# Patient Record
Sex: Male | Born: 1970 | Race: Black or African American | Hispanic: No | Marital: Single | Smoking: Never smoker
Health system: Southern US, Community
[De-identification: ages and names within clinical notes are randomized; demographics above are authoritative.]

## PROBLEM LIST (undated history)

## (undated) DIAGNOSIS — K219 Gastro-esophageal reflux disease without esophagitis: Secondary | ICD-10-CM

## (undated) DIAGNOSIS — N189 Chronic kidney disease, unspecified: Secondary | ICD-10-CM

## (undated) DIAGNOSIS — G4730 Sleep apnea, unspecified: Secondary | ICD-10-CM

## (undated) DIAGNOSIS — J189 Pneumonia, unspecified organism: Secondary | ICD-10-CM

## (undated) DIAGNOSIS — I10 Essential (primary) hypertension: Secondary | ICD-10-CM

## (undated) DIAGNOSIS — D759 Disease of blood and blood-forming organs, unspecified: Secondary | ICD-10-CM

## (undated) DIAGNOSIS — Z973 Presence of spectacles and contact lenses: Secondary | ICD-10-CM

## (undated) HISTORY — DX: Sleep apnea, unspecified: G47.30

## (undated) HISTORY — DX: Essential (primary) hypertension: I10

## (undated) HISTORY — PX: OTHER SURGICAL HISTORY: SHX169

## (undated) HISTORY — PX: AV FISTULA PLACEMENT: SHX1204

---

## 2001-01-07 ENCOUNTER — Emergency Department (HOSPITAL_COMMUNITY): Admission: EM | Admit: 2001-01-07 | Discharge: 2001-01-07 | Payer: Self-pay | Admitting: *Deleted

## 2001-01-08 ENCOUNTER — Encounter: Payer: Self-pay | Admitting: Emergency Medicine

## 2001-03-01 ENCOUNTER — Encounter: Admission: RE | Admit: 2001-03-01 | Discharge: 2001-04-19 | Payer: Self-pay | Admitting: Occupational Medicine

## 2004-11-16 ENCOUNTER — Emergency Department (HOSPITAL_COMMUNITY): Admission: EM | Admit: 2004-11-16 | Discharge: 2004-11-16 | Payer: Self-pay | Admitting: Family Medicine

## 2009-03-30 HISTORY — PX: PERITONEAL CATHETER INSERTION: SHX2223

## 2009-07-25 ENCOUNTER — Emergency Department (HOSPITAL_COMMUNITY): Admission: EM | Admit: 2009-07-25 | Discharge: 2009-07-25 | Payer: Self-pay | Admitting: Family Medicine

## 2009-08-12 ENCOUNTER — Inpatient Hospital Stay (HOSPITAL_COMMUNITY): Admission: EM | Admit: 2009-08-12 | Discharge: 2009-08-29 | Payer: Self-pay | Admitting: Emergency Medicine

## 2009-08-12 ENCOUNTER — Ambulatory Visit: Payer: Self-pay | Admitting: Emergency Medicine

## 2009-08-12 ENCOUNTER — Encounter (INDEPENDENT_AMBULATORY_CARE_PROVIDER_SITE_OTHER): Payer: Self-pay | Admitting: Internal Medicine

## 2009-08-12 ENCOUNTER — Ambulatory Visit: Payer: Self-pay | Admitting: Oncology

## 2009-08-12 ENCOUNTER — Ambulatory Visit: Payer: Self-pay | Admitting: Internal Medicine

## 2009-08-15 ENCOUNTER — Encounter (INDEPENDENT_AMBULATORY_CARE_PROVIDER_SITE_OTHER): Payer: Self-pay | Admitting: Family Medicine

## 2009-08-21 ENCOUNTER — Ambulatory Visit: Payer: Self-pay | Admitting: Vascular Surgery

## 2009-08-21 ENCOUNTER — Encounter: Payer: Self-pay | Admitting: Vascular Surgery

## 2009-09-11 ENCOUNTER — Ambulatory Visit (HOSPITAL_COMMUNITY): Admission: RE | Admit: 2009-09-11 | Discharge: 2009-09-11 | Payer: Self-pay | Admitting: Nephrology

## 2009-10-04 ENCOUNTER — Inpatient Hospital Stay (HOSPITAL_COMMUNITY): Admission: RE | Admit: 2009-10-04 | Discharge: 2009-10-05 | Payer: Self-pay | Admitting: General Surgery

## 2009-11-19 ENCOUNTER — Ambulatory Visit (HOSPITAL_COMMUNITY): Admission: RE | Admit: 2009-11-19 | Discharge: 2009-11-19 | Payer: Self-pay | Admitting: Nephrology

## 2010-02-06 ENCOUNTER — Ambulatory Visit (HOSPITAL_COMMUNITY): Admission: RE | Admit: 2010-02-06 | Discharge: 2010-02-06 | Payer: Self-pay | Admitting: Nephrology

## 2010-06-10 LAB — CROSSMATCH
ABO/RH(D): A POS
Antibody Screen: NEGATIVE
Unit division: 0

## 2010-06-15 LAB — CBC
HCT: 45 % (ref 39.0–52.0)
Hemoglobin: 12 g/dL — ABNORMAL LOW (ref 13.0–17.0)
Hemoglobin: 14.5 g/dL (ref 13.0–17.0)
MCH: 28 pg (ref 26.0–34.0)
MCHC: 32 g/dL (ref 30.0–36.0)
MCV: 86.9 fL (ref 78.0–100.0)
Platelets: 194 10*3/uL (ref 150–400)
RBC: 4.35 MIL/uL (ref 4.22–5.81)
RDW: 18.2 % — ABNORMAL HIGH (ref 11.5–15.5)
RDW: 18.5 % — ABNORMAL HIGH (ref 11.5–15.5)

## 2010-06-15 LAB — BASIC METABOLIC PANEL
CO2: 28 mEq/L (ref 19–32)
Calcium: 9.1 mg/dL (ref 8.4–10.5)
Calcium: 9.6 mg/dL (ref 8.4–10.5)
Chloride: 100 mEq/L (ref 96–112)
Chloride: 95 mEq/L — ABNORMAL LOW (ref 96–112)
Creatinine, Ser: 12.25 mg/dL — ABNORMAL HIGH (ref 0.4–1.5)
GFR calc Af Amer: 6 mL/min — ABNORMAL LOW (ref >60)
GFR calc Af Amer: 7 mL/min — ABNORMAL LOW (ref >60)
Glucose, Bld: 111 mg/dL — ABNORMAL HIGH (ref 70–99)
Potassium: 4.7 mEq/L (ref 3.5–5.1)

## 2010-06-15 LAB — DIFFERENTIAL
Eosinophils Absolute: 0.2 10*3/uL (ref 0.0–0.7)
Lymphocytes Relative: 31 % (ref 12–46)
Lymphs Abs: 2.6 10*3/uL (ref 0.7–4.0)
Monocytes Absolute: 1.1 10*3/uL — ABNORMAL HIGH (ref 0.1–1.0)
Monocytes Relative: 14 % — ABNORMAL HIGH (ref 3–12)
Neutro Abs: 4.4 10*3/uL (ref 1.7–7.7)
Neutrophils Relative %: 53 % (ref 43–77)

## 2010-06-15 LAB — POCT I-STAT 4, (NA,K, GLUC, HGB,HCT)
Glucose, Bld: 96 mg/dL (ref 70–99)
Hemoglobin: 15.6 g/dL (ref 13.0–17.0)
Sodium: 139 mEq/L (ref 135–145)

## 2010-06-15 LAB — RENAL FUNCTION PANEL
Chloride: 96 mEq/L (ref 96–112)
Creatinine, Ser: 12.11 mg/dL — ABNORMAL HIGH (ref 0.4–1.5)
GFR calc Af Amer: 6 mL/min — ABNORMAL LOW (ref >60)
Glucose, Bld: 111 mg/dL — ABNORMAL HIGH (ref 70–99)
Phosphorus: 7.1 mg/dL — ABNORMAL HIGH (ref 2.3–4.6)
Sodium: 137 mEq/L (ref 135–145)

## 2010-06-16 LAB — THERAPEUTIC PLASMA EXCHANGE (BLOOD BANK)
Plasma Exchange: 4050
Plasma Exchange: 4250

## 2010-06-16 LAB — BRAIN NATRIURETIC PEPTIDE
Pro B Natriuretic peptide (BNP): 1856 pg/mL — ABNORMAL HIGH (ref 0.0–100.0)
Pro B Natriuretic peptide (BNP): 1929 pg/mL — ABNORMAL HIGH (ref 0.0–100.0)

## 2010-06-16 LAB — RENAL FUNCTION PANEL
Albumin: 2.7 g/dL — ABNORMAL LOW (ref 3.5–5.2)
Albumin: 3.2 g/dL — ABNORMAL LOW (ref 3.5–5.2)
Albumin: 3.2 g/dL — ABNORMAL LOW (ref 3.5–5.2)
Albumin: 3.2 g/dL — ABNORMAL LOW (ref 3.5–5.2)
Albumin: 3.4 g/dL — ABNORMAL LOW (ref 3.5–5.2)
Albumin: 3.5 g/dL (ref 3.5–5.2)
BUN: 57 mg/dL — ABNORMAL HIGH (ref 6–23)
BUN: 103 mg/dL — ABNORMAL HIGH (ref 6–23)
BUN: 115 mg/dL — ABNORMAL HIGH (ref 6–23)
BUN: 122 mg/dL — ABNORMAL HIGH (ref 6–23)
BUN: 45 mg/dL — ABNORMAL HIGH (ref 6–23)
BUN: 84 mg/dL — ABNORMAL HIGH (ref 6–23)
BUN: 87 mg/dL — ABNORMAL HIGH (ref 6–23)
CO2: 24 mEq/L (ref 19–32)
CO2: 26 mEq/L (ref 19–32)
CO2: 28 mEq/L (ref 19–32)
CO2: 28 mEq/L (ref 19–32)
CO2: 29 mEq/L (ref 19–32)
CO2: 31 mEq/L (ref 19–32)
CO2: 33 mEq/L — ABNORMAL HIGH (ref 19–32)
CO2: 34 mEq/L — ABNORMAL HIGH (ref 19–32)
CO2: 35 mEq/L — ABNORMAL HIGH (ref 19–32)
CO2: 37 mEq/L — ABNORMAL HIGH (ref 19–32)
Calcium: 8.7 mg/dL (ref 8.4–10.5)
Calcium: 8.1 mg/dL — ABNORMAL LOW (ref 8.4–10.5)
Calcium: 8.2 mg/dL — ABNORMAL LOW (ref 8.4–10.5)
Calcium: 8.3 mg/dL — ABNORMAL LOW (ref 8.4–10.5)
Calcium: 8.4 mg/dL (ref 8.4–10.5)
Calcium: 8.4 mg/dL (ref 8.4–10.5)
Calcium: 8.5 mg/dL (ref 8.4–10.5)
Calcium: 8.6 mg/dL (ref 8.4–10.5)
Calcium: 8.8 mg/dL (ref 8.4–10.5)
Calcium: 9.2 mg/dL (ref 8.4–10.5)
Chloride: 86 mEq/L — ABNORMAL LOW (ref 96–112)
Chloride: 94 mEq/L — ABNORMAL LOW (ref 96–112)
Chloride: 94 mEq/L — ABNORMAL LOW (ref 96–112)
Chloride: 97 mEq/L (ref 96–112)
Chloride: 98 mEq/L (ref 96–112)
Chloride: 98 mEq/L (ref 96–112)
Creatinine, Ser: 10.9 mg/dL — ABNORMAL HIGH (ref 0.4–1.5)
Creatinine, Ser: 13.84 mg/dL — ABNORMAL HIGH (ref 0.4–1.5)
Creatinine, Ser: 14.51 mg/dL — ABNORMAL HIGH (ref 0.4–1.5)
Creatinine, Ser: 14.8 mg/dL — ABNORMAL HIGH (ref 0.4–1.5)
GFR calc Af Amer: 6 mL/min — ABNORMAL LOW (ref >60)
GFR calc Af Amer: 4 mL/min — ABNORMAL LOW (ref >60)
GFR calc Af Amer: 4 mL/min — ABNORMAL LOW (ref >60)
GFR calc Af Amer: 5 mL/min — ABNORMAL LOW (ref >60)
GFR calc Af Amer: 5 mL/min — ABNORMAL LOW (ref >60)
GFR calc Af Amer: 5 mL/min — ABNORMAL LOW (ref >60)
GFR calc Af Amer: 6 mL/min — ABNORMAL LOW (ref >60)
GFR calc Af Amer: 6 mL/min — ABNORMAL LOW (ref >60)
GFR calc Af Amer: 6 mL/min — ABNORMAL LOW (ref >60)
GFR calc Af Amer: 8 mL/min — ABNORMAL LOW (ref >60)
GFR calc Af Amer: 8 mL/min — ABNORMAL LOW (ref >60)
GFR calc non Af Amer: 4 mL/min — ABNORMAL LOW (ref >60)
GFR calc non Af Amer: 4 mL/min — ABNORMAL LOW (ref >60)
GFR calc non Af Amer: 4 mL/min — ABNORMAL LOW (ref >60)
GFR calc non Af Amer: 4 mL/min — ABNORMAL LOW (ref >60)
GFR calc non Af Amer: 5 mL/min — ABNORMAL LOW (ref >60)
GFR calc non Af Amer: 5 mL/min — ABNORMAL LOW (ref >60)
GFR calc non Af Amer: 5 mL/min — ABNORMAL LOW (ref >60)
GFR calc non Af Amer: 7 mL/min — ABNORMAL LOW (ref >60)
GFR calc non Af Amer: 7 mL/min — ABNORMAL LOW (ref >60)
Glucose, Bld: 92 mg/dL (ref 70–99)
Glucose, Bld: 102 mg/dL — ABNORMAL HIGH (ref 70–99)
Glucose, Bld: 103 mg/dL — ABNORMAL HIGH (ref 70–99)
Glucose, Bld: 109 mg/dL — ABNORMAL HIGH (ref 70–99)
Glucose, Bld: 127 mg/dL — ABNORMAL HIGH (ref 70–99)
Glucose, Bld: 151 mg/dL — ABNORMAL HIGH (ref 70–99)
Glucose, Bld: 93 mg/dL (ref 70–99)
Glucose, Bld: 96 mg/dL (ref 70–99)
Glucose, Bld: 97 mg/dL (ref 70–99)
Glucose, Bld: 99 mg/dL (ref 70–99)
Phosphorus: 4.4 mg/dL (ref 2.3–4.6)
Phosphorus: 11.5 mg/dL (ref 2.3–4.6)
Phosphorus: 4.3 mg/dL (ref 2.3–4.6)
Phosphorus: 5.2 mg/dL — ABNORMAL HIGH (ref 2.3–4.6)
Phosphorus: 5.7 mg/dL — ABNORMAL HIGH (ref 2.3–4.6)
Phosphorus: 6.2 mg/dL — ABNORMAL HIGH (ref 2.3–4.6)
Phosphorus: 7.1 mg/dL — ABNORMAL HIGH (ref 2.3–4.6)
Phosphorus: 7.7 mg/dL — ABNORMAL HIGH (ref 2.3–4.6)
Phosphorus: 8.9 mg/dL — ABNORMAL HIGH (ref 2.3–4.6)
Phosphorus: 9.9 mg/dL (ref 2.3–4.6)
Potassium: 3.3 mEq/L — ABNORMAL LOW (ref 3.5–5.1)
Potassium: 3.7 mEq/L (ref 3.5–5.1)
Potassium: 3.9 mEq/L (ref 3.5–5.1)
Potassium: 4.1 mEq/L (ref 3.5–5.1)
Potassium: 4.8 mEq/L (ref 3.5–5.1)
Sodium: 135 mEq/L (ref 135–145)
Sodium: 134 mEq/L — ABNORMAL LOW (ref 135–145)
Sodium: 135 mEq/L (ref 135–145)
Sodium: 135 mEq/L (ref 135–145)
Sodium: 136 mEq/L (ref 135–145)
Sodium: 137 mEq/L (ref 135–145)
Sodium: 138 mEq/L (ref 135–145)
Sodium: 138 mEq/L (ref 135–145)
Sodium: 139 mEq/L (ref 135–145)
Sodium: 139 mEq/L (ref 135–145)
Sodium: 140 mEq/L (ref 135–145)
Sodium: 141 mEq/L (ref 135–145)

## 2010-06-16 LAB — DIFFERENTIAL
Basophils Absolute: 0 10*3/uL (ref 0.0–0.1)
Basophils Absolute: 0 10*3/uL (ref 0.0–0.1)
Basophils Absolute: 0 10*3/uL (ref 0.0–0.1)
Basophils Absolute: 0 10*3/uL (ref 0.0–0.1)
Basophils Absolute: 0 10*3/uL (ref 0.0–0.1)
Basophils Absolute: 0.1 10*3/uL (ref 0.0–0.1)
Basophils Absolute: 0.1 10*3/uL (ref 0.0–0.1)
Basophils Relative: 1 % (ref 0–1)
Basophils Relative: 1 % (ref 0–1)
Basophils Relative: 0 % (ref 0–1)
Basophils Relative: 0 % (ref 0–1)
Basophils Relative: 0 % (ref 0–1)
Basophils Relative: 0 % (ref 0–1)
Basophils Relative: 0 % (ref 0–1)
Basophils Relative: 0 % (ref 0–1)
Basophils Relative: 0 % (ref 0–1)
Basophils Relative: 0 % (ref 0–1)
Basophils Relative: 1 % (ref 0–1)
Eosinophils Absolute: 0.2 10*3/uL (ref 0.0–0.7)
Eosinophils Absolute: 0.3 10*3/uL (ref 0.0–0.7)
Eosinophils Absolute: 0 10*3/uL (ref 0.0–0.7)
Eosinophils Absolute: 0 10*3/uL (ref 0.0–0.7)
Eosinophils Absolute: 0 10*3/uL (ref 0.0–0.7)
Eosinophils Absolute: 0.1 10*3/uL (ref 0.0–0.7)
Eosinophils Absolute: 0.3 10*3/uL (ref 0.0–0.7)
Eosinophils Absolute: 0.4 10*3/uL (ref 0.0–0.7)
Eosinophils Relative: 0 % (ref 0–5)
Eosinophils Relative: 0 % (ref 0–5)
Eosinophils Relative: 2 % (ref 0–5)
Eosinophils Relative: 3 % (ref 0–5)
Lymphocytes Relative: 11 % — ABNORMAL LOW (ref 12–46)
Lymphocytes Relative: 13 % (ref 12–46)
Lymphocytes Relative: 14 % (ref 12–46)
Lymphocytes Relative: 14 % (ref 12–46)
Lymphocytes Relative: 16 % (ref 12–46)
Lymphocytes Relative: 2 % — ABNORMAL LOW (ref 12–46)
Lymphocytes Relative: 4 % — ABNORMAL LOW (ref 12–46)
Lymphocytes Relative: 8 % — ABNORMAL LOW (ref 12–46)
Lymphs Abs: 1.3 10*3/uL (ref 0.7–4.0)
Lymphs Abs: 1.8 10*3/uL (ref 0.7–4.0)
Lymphs Abs: 0.4 10*3/uL — ABNORMAL LOW (ref 0.7–4.0)
Lymphs Abs: 1 10*3/uL (ref 0.7–4.0)
Lymphs Abs: 1.5 10*3/uL (ref 0.7–4.0)
Lymphs Abs: 1.7 10*3/uL (ref 0.7–4.0)
Lymphs Abs: 1.8 10*3/uL (ref 0.7–4.0)
Lymphs Abs: 1.8 10*3/uL (ref 0.7–4.0)
Lymphs Abs: 1.8 10*3/uL (ref 0.7–4.0)
Monocytes Absolute: 1.2 10*3/uL — ABNORMAL HIGH (ref 0.1–1.0)
Monocytes Absolute: 1.3 10*3/uL — ABNORMAL HIGH (ref 0.1–1.0)
Monocytes Absolute: 0.4 10*3/uL (ref 0.1–1.0)
Monocytes Absolute: 0.7 10*3/uL (ref 0.1–1.0)
Monocytes Absolute: 1.5 10*3/uL — ABNORMAL HIGH (ref 0.1–1.0)
Monocytes Absolute: 1.5 10*3/uL — ABNORMAL HIGH (ref 0.1–1.0)
Monocytes Absolute: 1.6 10*3/uL — ABNORMAL HIGH (ref 0.1–1.0)
Monocytes Relative: 11 % (ref 3–12)
Monocytes Relative: 14 % — ABNORMAL HIGH (ref 3–12)
Monocytes Relative: 10 % (ref 3–12)
Monocytes Relative: 11 % (ref 3–12)
Monocytes Relative: 11 % (ref 3–12)
Monocytes Relative: 2 % — ABNORMAL LOW (ref 3–12)
Monocytes Relative: 3 % (ref 3–12)
Monocytes Relative: 4 % (ref 3–12)
Monocytes Relative: 9 % (ref 3–12)
Neutro Abs: 11.3 10*3/uL — ABNORMAL HIGH (ref 1.7–7.7)
Neutro Abs: 13.6 10*3/uL — ABNORMAL HIGH (ref 1.7–7.7)
Neutro Abs: 16.9 10*3/uL — ABNORMAL HIGH (ref 1.7–7.7)
Neutro Abs: 17 10*3/uL — ABNORMAL HIGH (ref 1.7–7.7)
Neutro Abs: 7.3 10*3/uL (ref 1.7–7.7)
Neutro Abs: 8.4 10*3/uL — ABNORMAL HIGH (ref 1.7–7.7)
Neutro Abs: 9.3 10*3/uL — ABNORMAL HIGH (ref 1.7–7.7)
Neutro Abs: 9.7 10*3/uL — ABNORMAL HIGH (ref 1.7–7.7)
Neutro Abs: 9.9 10*3/uL — ABNORMAL HIGH (ref 1.7–7.7)
Neutro Abs: 9.9 10*3/uL — ABNORMAL HIGH (ref 1.7–7.7)
Neutrophils Relative %: 65 % (ref 43–77)
Neutrophils Relative %: 75 % (ref 43–77)
Neutrophils Relative %: 72 % (ref 43–77)
Neutrophils Relative %: 72 % (ref 43–77)
Neutrophils Relative %: 72 % (ref 43–77)
Neutrophils Relative %: 79 % — ABNORMAL HIGH (ref 43–77)
Neutrophils Relative %: 80 % — ABNORMAL HIGH (ref 43–77)
Neutrophils Relative %: 80 % — ABNORMAL HIGH (ref 43–77)
Neutrophils Relative %: 88 % — ABNORMAL HIGH (ref 43–77)
Neutrophils Relative %: 92 % — ABNORMAL HIGH (ref 43–77)
Neutrophils Relative %: 94 % — ABNORMAL HIGH (ref 43–77)
Neutrophils Relative %: 94 % — ABNORMAL HIGH (ref 43–77)

## 2010-06-16 LAB — CBC
HCT: 28 % — ABNORMAL LOW (ref 39.0–52.0)
HCT: 28.1 % — ABNORMAL LOW (ref 39.0–52.0)
HCT: 26.8 % — ABNORMAL LOW (ref 39.0–52.0)
HCT: 27 % — ABNORMAL LOW (ref 39.0–52.0)
HCT: 27.2 % — ABNORMAL LOW (ref 39.0–52.0)
HCT: 27.6 % — ABNORMAL LOW (ref 39.0–52.0)
HCT: 29.5 % — ABNORMAL LOW (ref 39.0–52.0)
Hemoglobin: 9.2 g/dL — ABNORMAL LOW (ref 13.0–17.0)
Hemoglobin: 9.2 g/dL — ABNORMAL LOW (ref 13.0–17.0)
Hemoglobin: 10.1 g/dL — ABNORMAL LOW (ref 13.0–17.0)
Hemoglobin: 10.2 g/dL — ABNORMAL LOW (ref 13.0–17.0)
Hemoglobin: 11.3 g/dL — ABNORMAL LOW (ref 13.0–17.0)
Hemoglobin: 7.9 g/dL — ABNORMAL LOW (ref 13.0–17.0)
Hemoglobin: 9 g/dL — ABNORMAL LOW (ref 13.0–17.0)
Hemoglobin: 9 g/dL — ABNORMAL LOW (ref 13.0–17.0)
Hemoglobin: 9.1 g/dL — ABNORMAL LOW (ref 13.0–17.0)
Hemoglobin: 9.4 g/dL — ABNORMAL LOW (ref 13.0–17.0)
Hemoglobin: 9.5 g/dL — ABNORMAL LOW (ref 13.0–17.0)
MCHC: 32.9 g/dL (ref 30.0–36.0)
MCHC: 32.6 g/dL (ref 30.0–36.0)
MCHC: 33 g/dL (ref 30.0–36.0)
MCHC: 33.1 g/dL (ref 30.0–36.0)
MCHC: 33.2 g/dL (ref 30.0–36.0)
MCHC: 33.9 g/dL (ref 30.0–36.0)
MCHC: 33.9 g/dL (ref 30.0–36.0)
MCHC: 34 g/dL (ref 30.0–36.0)
MCHC: 34.1 g/dL (ref 30.0–36.0)
MCV: 87.2 fL (ref 78.0–100.0)
MCV: 86.3 fL (ref 78.0–100.0)
MCV: 86.3 fL (ref 78.0–100.0)
MCV: 86.9 fL (ref 78.0–100.0)
MCV: 86.9 fL (ref 78.0–100.0)
MCV: 87.4 fL (ref 78.0–100.0)
MCV: 87.9 fL (ref 78.0–100.0)
Platelets: 252 10*3/uL (ref 150–400)
Platelets: 290 10*3/uL (ref 150–400)
Platelets: 126 10*3/uL — ABNORMAL LOW (ref 150–400)
Platelets: 273 10*3/uL (ref 150–400)
Platelets: 273 10*3/uL (ref 150–400)
Platelets: 301 10*3/uL (ref 150–400)
Platelets: 316 10*3/uL (ref 150–400)
Platelets: 83 10*3/uL — ABNORMAL LOW (ref 150–400)
Platelets: 83 10*3/uL — ABNORMAL LOW (ref 150–400)
Platelets: 99 10*3/uL — ABNORMAL LOW (ref 150–400)
RBC: 3.22 MIL/uL — ABNORMAL LOW (ref 4.22–5.81)
RBC: 2.65 MIL/uL — ABNORMAL LOW (ref 4.22–5.81)
RBC: 2.83 MIL/uL — ABNORMAL LOW (ref 4.22–5.81)
RBC: 3.07 MIL/uL — ABNORMAL LOW (ref 4.22–5.81)
RBC: 3.1 MIL/uL — ABNORMAL LOW (ref 4.22–5.81)
RBC: 3.1 MIL/uL — ABNORMAL LOW (ref 4.22–5.81)
RBC: 3.15 MIL/uL — ABNORMAL LOW (ref 4.22–5.81)
RBC: 3.21 MIL/uL — ABNORMAL LOW (ref 4.22–5.81)
RBC: 3.26 MIL/uL — ABNORMAL LOW (ref 4.22–5.81)
RBC: 3.51 MIL/uL — ABNORMAL LOW (ref 4.22–5.81)
RBC: 3.52 MIL/uL — ABNORMAL LOW (ref 4.22–5.81)
RBC: 3.83 MIL/uL — ABNORMAL LOW (ref 4.22–5.81)
RDW: 17.1 % — ABNORMAL HIGH (ref 11.5–15.5)
RDW: 17.3 % — ABNORMAL HIGH (ref 11.5–15.5)
RDW: 17.5 % — ABNORMAL HIGH (ref 11.5–15.5)
RDW: 17.6 % — ABNORMAL HIGH (ref 11.5–15.5)
RDW: 17.7 % — ABNORMAL HIGH (ref 11.5–15.5)
RDW: 17.8 % — ABNORMAL HIGH (ref 11.5–15.5)
RDW: 18.1 % — ABNORMAL HIGH (ref 11.5–15.5)
RDW: 18.2 % — ABNORMAL HIGH (ref 11.5–15.5)
WBC: 9.9 10*3/uL (ref 4.0–10.5)
WBC: 11.6 10*3/uL — ABNORMAL HIGH (ref 4.0–10.5)
WBC: 13.2 10*3/uL — ABNORMAL HIGH (ref 4.0–10.5)
WBC: 13.5 10*3/uL — ABNORMAL HIGH (ref 4.0–10.5)
WBC: 13.7 10*3/uL — ABNORMAL HIGH (ref 4.0–10.5)
WBC: 13.8 10*3/uL — ABNORMAL HIGH (ref 4.0–10.5)
WBC: 17.3 10*3/uL — ABNORMAL HIGH (ref 4.0–10.5)
WBC: 18 10*3/uL — ABNORMAL HIGH (ref 4.0–10.5)
WBC: 18.8 10*3/uL — ABNORMAL HIGH (ref 4.0–10.5)

## 2010-06-16 LAB — RETICULOCYTES
RBC.: 2.87 MIL/uL — ABNORMAL LOW (ref 4.22–5.81)
RBC.: 3.48 MIL/uL — ABNORMAL LOW (ref 4.22–5.81)
Retic Count, Absolute: 87 10*3/uL (ref 19.0–186.0)
Retic Ct Pct: 2.7 % (ref 0.4–3.1)
Retic Ct Pct: 3 % (ref 0.4–3.1)
Retic Ct Pct: 3.3 % — ABNORMAL HIGH (ref 0.4–3.1)
Retic Ct Pct: 3.6 % — ABNORMAL HIGH (ref 0.4–3.1)
Retic Ct Pct: 3.8 % — ABNORMAL HIGH (ref 0.4–3.1)

## 2010-06-16 LAB — UIFE/LIGHT CHAINS/TP QN, 24-HR UR
Alpha 1, Urine: DETECTED — AB
Alpha 2, Urine: DETECTED — AB
Beta, Urine: DETECTED — AB
Free Kappa Lt Chains,Ur: 11.4 mg/dL — ABNORMAL HIGH (ref 0.04–1.51)
Free Lambda Excretion/Day: 32.48 mg/d
Gamma Globulin, Urine: DETECTED — AB
Time: 24 hours

## 2010-06-16 LAB — POCT I-STAT 3, ART BLOOD GAS (G3+)
Bicarbonate: 22.4 mEq/L (ref 20.0–24.0)
O2 Saturation: 89 %
Patient temperature: 98.6
TCO2: 23 mmol/L (ref 0–100)
pO2, Arterial: 52 mmHg — ABNORMAL LOW (ref 80.0–100.0)

## 2010-06-16 LAB — HEPATIC FUNCTION PANEL
AST: 21 U/L (ref 0–37)
Albumin: 3.2 g/dL — ABNORMAL LOW (ref 3.5–5.2)
Alkaline Phosphatase: 50 U/L (ref 39–117)
Bilirubin, Direct: 0.1 mg/dL (ref 0.0–0.3)
Indirect Bilirubin: 0.9 mg/dL (ref 0.3–0.9)
Total Bilirubin: 1 mg/dL (ref 0.3–1.2)
Total Protein: 6.3 g/dL (ref 6.0–8.3)
Total Protein: 7.1 g/dL (ref 6.0–8.3)

## 2010-06-16 LAB — PROTEIN ELECTROPH W RFLX QUANT IMMUNOGLOBULINS
M-Spike, %: NOT DETECTED g/dL
Total Protein ELP: 6.2 g/dL (ref 6.0–8.3)

## 2010-06-16 LAB — COMPREHENSIVE METABOLIC PANEL
ALT: 14 U/L (ref 0–53)
Alkaline Phosphatase: 66 U/L (ref 39–117)
CO2: 22 mEq/L (ref 19–32)
Calcium: 8.9 mg/dL (ref 8.4–10.5)
GFR calc non Af Amer: 5 mL/min — ABNORMAL LOW (ref >60)
Glucose, Bld: 138 mg/dL — ABNORMAL HIGH (ref 70–99)
Potassium: 3.2 mEq/L — ABNORMAL LOW (ref 3.5–5.1)
Sodium: 131 mEq/L — ABNORMAL LOW (ref 135–145)

## 2010-06-16 LAB — MAGNESIUM: Magnesium: 2.4 mg/dL (ref 1.5–2.5)

## 2010-06-16 LAB — CREATININE, URINE, RANDOM: Creatinine, Urine: 81.8 mg/dL

## 2010-06-16 LAB — LACTATE DEHYDROGENASE
LDH: 205 U/L (ref 94–250)
LDH: 184 U/L (ref 94–250)
LDH: 186 U/L (ref 94–250)
LDH: 190 U/L (ref 94–250)
LDH: 190 U/L (ref 94–250)
LDH: 191 U/L (ref 94–250)
LDH: 192 U/L (ref 94–250)
LDH: 205 U/L (ref 94–250)
LDH: 213 U/L (ref 94–250)
LDH: 221 U/L (ref 94–250)
LDH: 226 U/L (ref 94–250)
LDH: 226 U/L (ref 94–250)
LDH: 227 U/L (ref 94–250)
LDH: 256 U/L — ABNORMAL HIGH (ref 94–250)

## 2010-06-16 LAB — PROTIME-INR
INR: 1.09 (ref 0.00–1.49)
Prothrombin Time: 14 seconds (ref 11.6–15.2)

## 2010-06-16 LAB — DIC (DISSEMINATED INTRAVASCULAR COAGULATION)PANEL
INR: 1.18 (ref 0.00–1.49)
Prothrombin Time: 14.9 seconds (ref 11.6–15.2)
aPTT: 33 seconds (ref 24–37)

## 2010-06-16 LAB — FOLATE: Folate: 5.4 ng/mL

## 2010-06-16 LAB — PHOSPHORUS: Phosphorus: 6.2 mg/dL — ABNORMAL HIGH (ref 2.3–4.6)

## 2010-06-16 LAB — URINALYSIS, ROUTINE W REFLEX MICROSCOPIC
Bilirubin Urine: NEGATIVE
Glucose, UA: NEGATIVE mg/dL
Ketones, ur: NEGATIVE mg/dL
Leukocytes, UA: NEGATIVE
pH: 5.5 (ref 5.0–8.0)

## 2010-06-16 LAB — SODIUM, URINE, RANDOM: Sodium, Ur: 43 mEq/L

## 2010-06-16 LAB — ANTIBODY SCREEN: Antibody Screen: NEGATIVE

## 2010-06-16 LAB — ALT: ALT: 20 U/L (ref 0–53)

## 2010-06-16 LAB — CARDIAC PANEL(CRET KIN+CKTOT+MB+TROPI)
CK, MB: 3 ng/mL (ref 0.3–4.0)
Relative Index: INVALID (ref 0.0–2.5)
Relative Index: INVALID (ref 0.0–2.5)
Total CK: 78 U/L (ref 7–232)
Total CK: 92 U/L (ref 7–232)
Troponin I: 0.3 ng/mL — ABNORMAL HIGH (ref 0.00–0.06)

## 2010-06-16 LAB — BASIC METABOLIC PANEL
BUN: 39 mg/dL — ABNORMAL HIGH (ref 6–23)
BUN: 84 mg/dL — ABNORMAL HIGH (ref 6–23)
CO2: 24 mEq/L (ref 19–32)
Calcium: 9 mg/dL (ref 8.4–10.5)
Chloride: 94 mEq/L — ABNORMAL LOW (ref 96–112)
Creatinine, Ser: 12.9 mg/dL — ABNORMAL HIGH (ref 0.4–1.5)
GFR calc Af Amer: 9 mL/min — ABNORMAL LOW (ref >60)
GFR calc Af Amer: 5 mL/min — ABNORMAL LOW (ref >60)
Glucose, Bld: 98 mg/dL (ref 70–99)

## 2010-06-16 LAB — FERRITIN: Ferritin: 516 ng/mL — ABNORMAL HIGH (ref 22–322)

## 2010-06-16 LAB — CULTURE, BLOOD (ROUTINE X 2)

## 2010-06-16 LAB — PTH, INTACT AND CALCIUM: Calcium, Total (PTH): 8.9 mg/dL (ref 8.4–10.5)

## 2010-06-16 LAB — HEPATITIS PANEL, ACUTE: Hep B C IgM: NEGATIVE

## 2010-06-16 LAB — DIRECT ANTIGLOBULIN TEST (NOT AT ARMC)
DAT, IgG: NEGATIVE
DAT, complement: NEGATIVE

## 2010-06-16 LAB — URINE CULTURE

## 2010-06-16 LAB — ABO/RH: ABO/RH(D): A POS

## 2010-06-16 LAB — LIPID PANEL
Cholesterol: 163 mg/dL (ref 0–200)
HDL: 39 mg/dL — ABNORMAL LOW (ref >39)

## 2010-06-16 LAB — TSH: TSH: 0.885 u[IU]/mL (ref 0.350–4.500)

## 2010-06-16 LAB — PROTEIN / CREATININE RATIO, URINE
Creatinine, Urine: 80.9 mg/dL
Total Protein, Urine: 81 mg/dL

## 2010-06-16 LAB — SAVE SMEAR

## 2010-06-16 LAB — HIV ANTIBODY (ROUTINE TESTING W REFLEX): HIV: NONREACTIVE

## 2010-06-16 LAB — RAPID URINE DRUG SCREEN, HOSP PERFORMED
Barbiturates: NOT DETECTED
Benzodiazepines: NOT DETECTED

## 2010-06-16 LAB — IRON AND TIBC: TIBC: 218 ug/dL (ref 215–435)

## 2010-06-16 LAB — HEMOGLOBIN A1C: Mean Plasma Glucose: 97 mg/dL (ref <117)

## 2010-06-16 LAB — URINE MICROSCOPIC-ADD ON

## 2010-06-16 LAB — CK TOTAL AND CKMB (NOT AT ARMC): Total CK: 93 U/L (ref 7–232)

## 2010-06-16 LAB — VITAMIN B12: Vitamin B-12: 304 pg/mL (ref 211–911)

## 2010-06-17 LAB — POCT I-STAT, CHEM 8
BUN: 49 mg/dL — ABNORMAL HIGH (ref 6–23)
Calcium, Ion: 1.02 mmol/L — ABNORMAL LOW (ref 1.12–1.32)
Glucose, Bld: 155 mg/dL — ABNORMAL HIGH (ref 70–99)
TCO2: 27 mmol/L (ref 0–100)

## 2010-08-08 IMAGING — CR DG CHEST 2V
2 series · 2 of 2 positions shown · non-contrast
Comparison: Five 17 1455

CLINICAL DATA: Renal disease, preop

CHEST - 2 VIEW

[view not recorded (1 of 2)]
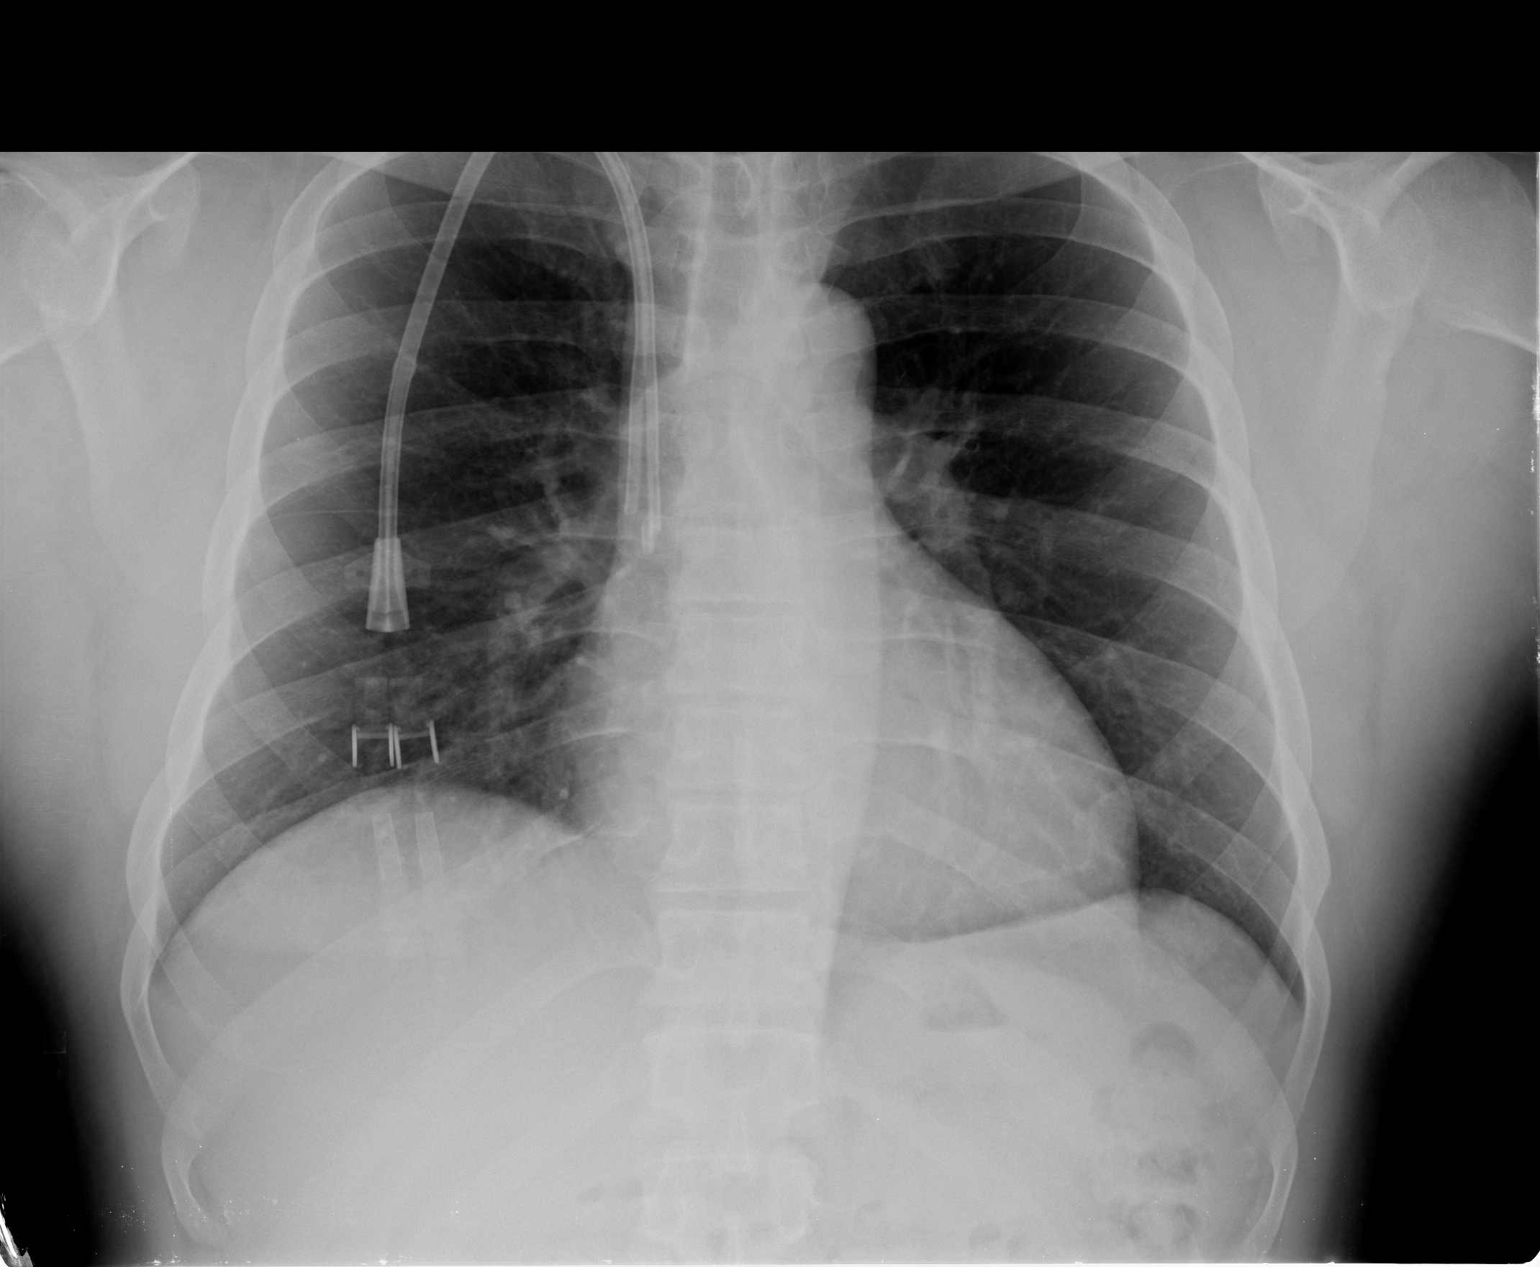

[view not recorded (2 of 2)]
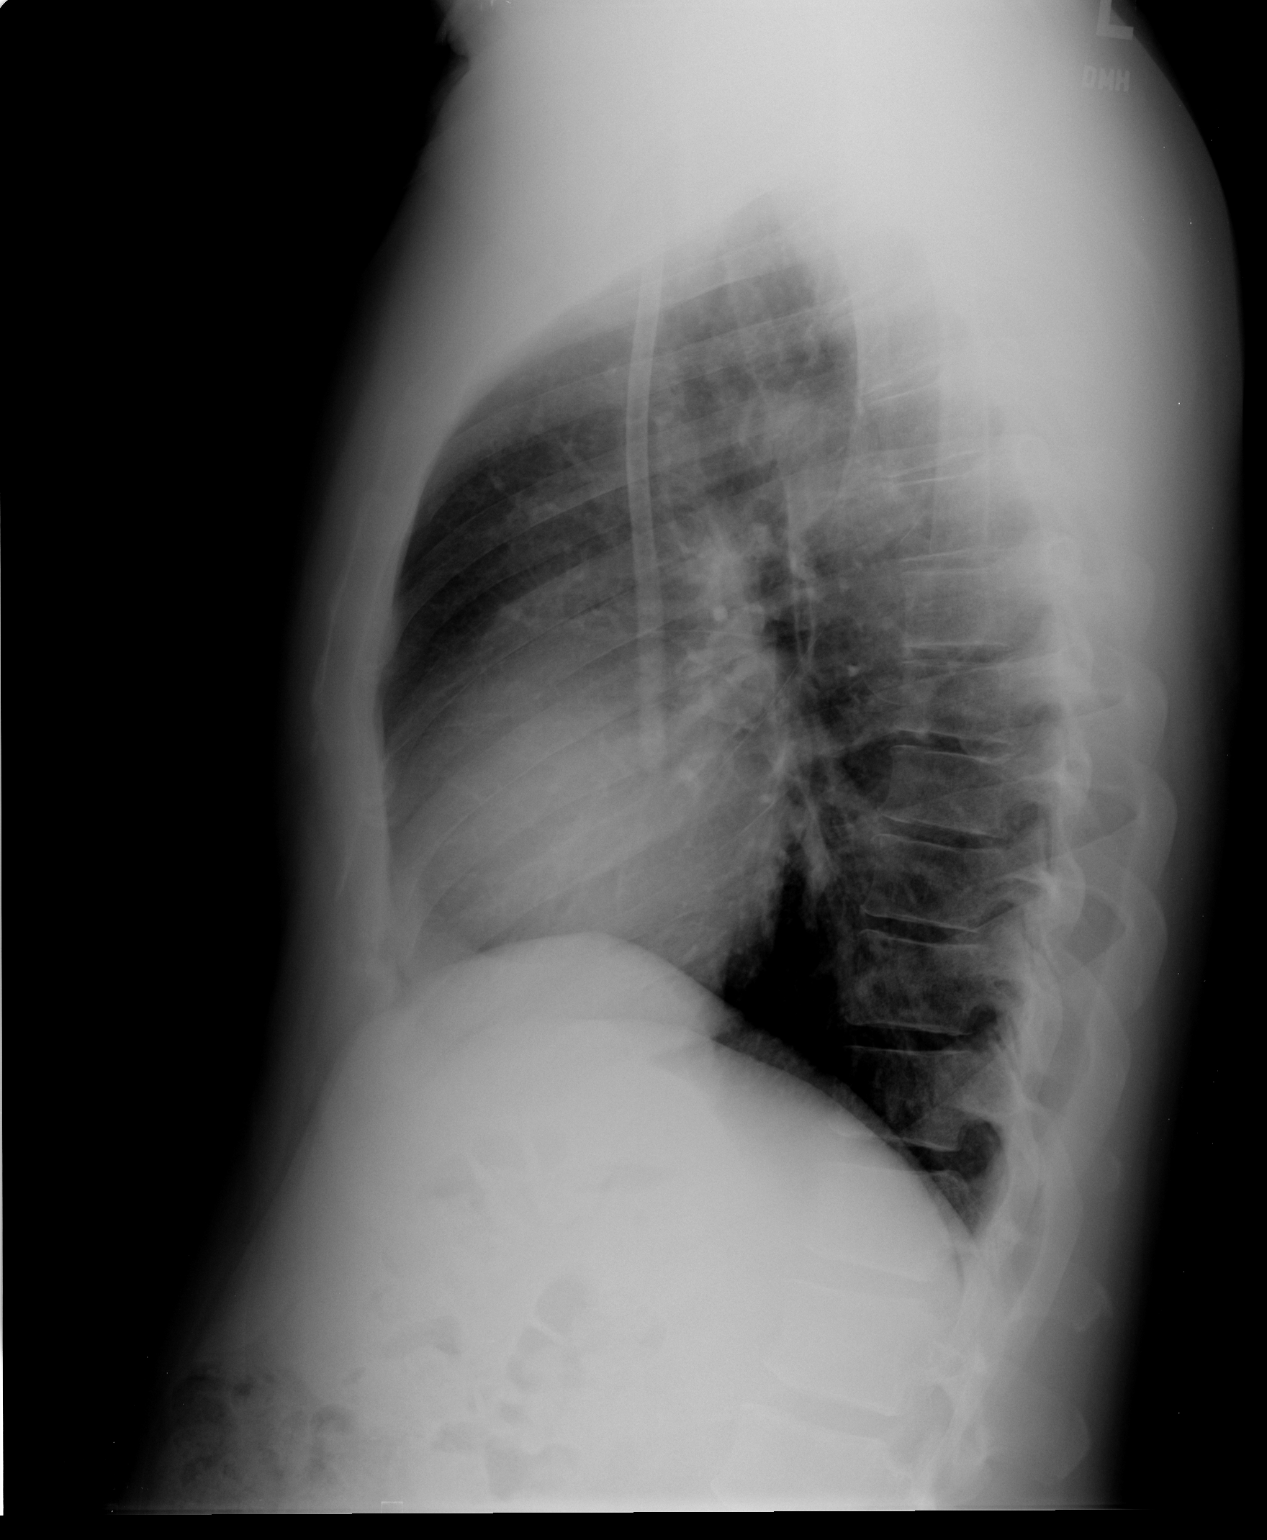

[2 of 2 positions shown; findings below may reference images not displayed]

FINDINGS: Tunneled right IJ hemodialysis catheter extends to the
distal SVC.  No pneumothorax.  Lungs clear.  Heart size normal.  No
effusion.  Regional bones unremarkable.
IMPRESSION: No acute disease

## 2012-03-16 ENCOUNTER — Ambulatory Visit (INDEPENDENT_AMBULATORY_CARE_PROVIDER_SITE_OTHER): Payer: Medicare Other | Admitting: Pulmonary Disease

## 2012-03-16 ENCOUNTER — Encounter: Payer: Self-pay | Admitting: Pulmonary Disease

## 2012-03-16 VITALS — BP 122/80 | HR 100 | Temp 98.5°F | Ht 68.0 in | Wt 283.0 lb

## 2012-03-16 DIAGNOSIS — G4733 Obstructive sleep apnea (adult) (pediatric): Secondary | ICD-10-CM | POA: Insufficient documentation

## 2012-03-16 NOTE — Assessment & Plan Note (Signed)
The patient's history is very suggestive of clinically significant sleep apnea.  I have had a long discussion with him about sleep apnea, including its impact to his quality of life and cardiovascular health.  I think he needs to have a sleep study done, and the patient is agreeable.  I have also encouraged him to work aggressively on weight loss.

## 2012-03-16 NOTE — Progress Notes (Signed)
  Subjective:    Patient ID: Tyler Moore, male    DOB: 1970-07-04, 41 y.o.   MRN: [id number]  HPI The patient is a very pleasant 41 year old male who I been asked to see for possible obstructive sleep apnea.  He has been noted to have loud snoring by his bed partner, as well as a very abnormal breathing pattern during sleep.  The patient awakens at least 5 times a night, but it should be noted that he does peritoneal dialysis overnight.  He is not rested in the mornings upon arising, and notes significant sleepiness during the day with any period of inactivity.  He states that he can doze very easily.  He can fall asleep watching television and movies in the evening, but denies any sleepiness with driving.  The patient states that his weight is up 30 pounds over the last 2 years, and his Epworth sleepiness score today is abnormal at 13.  Sleep Questionnaire: What time do you typically go to bed?( Between what hours) 1130pm How long does it take you to fall asleep? 5-10 min How many times during the night do you wake up? 5 What time do you get out of bed to start your day? 0800 Do you drive or operate heavy machinery in your occupation? No How much has your weight changed (up or down) over the past two years? (In pounds) 50 lb (22.68 kg) Have you ever had a sleep study before? No Do you currently use CPAP? No Do you wear oxygen at any time? No    Review of Systems  Constitutional: Positive for fatigue and unexpected weight change. Negative for fever.  HENT: Negative for ear pain, nosebleeds, congestion, sore throat, rhinorrhea, sneezing, trouble swallowing, dental problem, postnasal drip and sinus pressure.   Eyes: Negative for redness and itching.  Respiratory: Negative for cough, chest tightness, shortness of breath and wheezing.   Cardiovascular: Negative for palpitations and leg swelling.  Gastrointestinal: Negative for nausea and vomiting.  Genitourinary: Negative for dysuria.   Musculoskeletal: Negative for joint swelling.  Skin: Negative for rash.  Neurological: Negative for headaches.  Hematological: Does not bruise/bleed easily.  Psychiatric/Behavioral: Negative for dysphoric mood. The patient is not nervous/anxious.        Objective:   Physical Exam Constitutional:  Obese male, no acute distress  HENT:  Nares patent without discharge  Oropharynx without exudate, palate and uvula are thick and elongated  Eyes:  Perrla, eomi, no scleral icterus  Neck:  No JVD, no TMG  Cardiovascular:  Normal rate, regular rhythm, no rubs or gallops.  No murmurs        Intact distal pulses  Pulmonary :  Normal breath sounds, no stridor or respiratory distress   No rales, rhonchi, or wheezing  Abdominal:  Soft, nondistended, bowel sounds present.  No tenderness noted.   Musculoskeletal:  No lower extremity edema noted.  Lymph Nodes:  No cervical lymphadenopathy noted  Skin:  No cyanosis noted  Neurologic:  Alert, appropriate, moves all 4 extremities without obvious deficit.         Assessment & Plan:

## 2012-03-16 NOTE — Patient Instructions (Addendum)
Will schedule for a sleep study. Will call to schedule followup once the results are available for review.

## 2012-04-19 ENCOUNTER — Ambulatory Visit (HOSPITAL_BASED_OUTPATIENT_CLINIC_OR_DEPARTMENT_OTHER): Payer: Medicare Other | Attending: Pulmonary Disease | Admitting: Radiology

## 2012-04-19 VITALS — Ht 68.0 in | Wt 278.0 lb

## 2012-04-19 DIAGNOSIS — G4733 Obstructive sleep apnea (adult) (pediatric): Secondary | ICD-10-CM | POA: Insufficient documentation

## 2012-04-27 ENCOUNTER — Telehealth: Payer: Self-pay | Admitting: Pulmonary Disease

## 2012-04-27 DIAGNOSIS — G4710 Hypersomnia, unspecified: Secondary | ICD-10-CM

## 2012-04-27 DIAGNOSIS — G4730 Sleep apnea, unspecified: Secondary | ICD-10-CM

## 2012-04-27 NOTE — Telephone Encounter (Signed)
Patient scheduled for f/u appt to review sleep study per [facility]. Appt is 05/11/12 at 4pm, pt aware.

## 2012-04-27 NOTE — Procedures (Signed)
NAMEALBERTO, Tyler Moore               ACCOUNT NO.:  [id number]  MEDICAL RECORD NO.:  [id number]          PATIENT TYPE:  OUT  LOCATION:  SLEEP CENTER                 FACILITY:  [facility]  PHYSICIAN:  Barbaraann Share, MD,FCCPDATE OF BIRTH:  17-Dec-1970  DATE OF STUDY:  04/19/2012                           NOCTURNAL POLYSOMNOGRAM  REFERRING PHYSICIAN:  Barbaraann Share, MD,FCCP  INDICATION FOR THE STUDY:  Hypersomnia with sleep apnea.  EPWORTH SLEEPINESS SCORE:  12.  MEDICATIONS:  SLEEP ARCHITECTURE:  The patient had total sleep time of 255 minutes with very little slow-wave sleep or REM.  Sleep onset latency was mildly prolonged at 42 minutes and REM onset was prolonged at 253 minutes. Sleep efficiency was significantly reduced at 66%.  RESPIRATORY DATA:  The patient was found to have 38 apneas and 111 obstructive hypopneas, giving him an apnea-hypopnea index of 35 events per hour.  The events occurred in all body positions and there was loud snoring noted throughout.  OXYGEN DATA:  There was O2 desaturation as low as 81% with the patient's obstructive events.  CARDIAC DATA:  The patient was noted to have a rare episode of high- degree AV block for 1 to 2 beats that were associative with an obstructive apnea.  MOVEMENT-PARASOMNIA:  The patient had no significant leg jerks or other abnormal behavior seen.  IMPRESSIONS-RECOMMENDATIONS: 1. Moderate-to-severe obstructive sleep apnea/hypopnea syndrome, with     an AHI of 35 events per hour and oxygen desaturation as low as 81%.     Treatment for this degree of sleep apnea should focus primarily on     a trial of CPAP as well as aggressive weight loss. 2. Rare episode of high-degree AV block for only 1 to 2 beats, and     were associated with an obstructive apnea.  Clinical correlation     was suggested.     Barbaraann Share, MD,FCCP Diplomate, American Board of Sleep Medicine    [facility]/MEDQ  D:  04/27/2012 09:37:24  T:  04/27/2012  10:44:31  Job:  [id number]

## 2012-05-11 ENCOUNTER — Ambulatory Visit: Payer: Medicare Other | Admitting: Pulmonary Disease

## 2012-05-16 NOTE — Telephone Encounter (Signed)
lmomtcb x 1....needs to reschedule appt to review sleep study.

## 2012-11-09 ENCOUNTER — Encounter: Payer: Self-pay | Admitting: Pulmonary Disease

## 2012-11-09 ENCOUNTER — Ambulatory Visit (INDEPENDENT_AMBULATORY_CARE_PROVIDER_SITE_OTHER): Payer: Medicare Other | Admitting: Pulmonary Disease

## 2012-11-09 VITALS — BP 150/98 | HR 112 | Temp 96.2°F | Ht 68.0 in | Wt 288.0 lb

## 2012-11-09 DIAGNOSIS — G4733 Obstructive sleep apnea (adult) (pediatric): Secondary | ICD-10-CM

## 2012-11-09 NOTE — Patient Instructions (Addendum)
Will start on cpap.  Please call if having issues with tolerance Work on weight loss followup with me in 6 weeks.

## 2012-11-09 NOTE — Assessment & Plan Note (Signed)
The patient has moderate to severe sleep apnea by history 2 study, and will require CPAP therapy while working on weight loss.  He is agreeable to trying this. I will set the patient up on cpap at a moderate pressure level to allow for desensitization, and will troubleshoot the device over the next 4-6weeks if needed.  The pt is to call me if having issues with tolerance.  Will then optimize the pressure once patient is able to wear cpap on a consistent basis.

## 2012-11-09 NOTE — Progress Notes (Signed)
  Subjective:    Patient ID: Tyler Moore, male    DOB: Jan 06, 1971, 42 y.o.   MRN: [id number]  HPI Patient comes in today for followup after his sleep study in January.  He was found to have moderate to severe OSA, with an AHI of 35 events per hour.  I have reviewed the study with him in detail, and answered all of his questions.   Review of Systems  Constitutional: Negative for fever and unexpected weight change.  HENT: Negative for ear pain, nosebleeds, congestion, sore throat, rhinorrhea, sneezing, trouble swallowing, dental problem, postnasal drip and sinus pressure.   Eyes: Negative for redness and itching.  Respiratory: Negative for cough, chest tightness, shortness of breath and wheezing.   Cardiovascular: Negative for palpitations and leg swelling.  Gastrointestinal: Negative for nausea and vomiting.  Genitourinary: Negative for dysuria.  Musculoskeletal: Negative for joint swelling.  Skin: Negative for rash.  Neurological: Negative for headaches.  Hematological: Does not bruise/bleed easily.  Psychiatric/Behavioral: Negative for dysphoric mood. The patient is not nervous/anxious.        Objective:   Physical Exam Obese male in no acute distress Nose without purulence or discharge noted Neck without lymphadenopathy or thyromegaly Lower extremities have no edema, no cyanosis Alert and oriented, moves all 4 extremities.       Assessment & Plan:

## 2012-12-21 ENCOUNTER — Ambulatory Visit: Payer: Medicare Other | Admitting: Pulmonary Disease

## 2012-12-26 ENCOUNTER — Encounter: Payer: Self-pay | Admitting: Pulmonary Disease

## 2013-04-25 ENCOUNTER — Telehealth: Payer: Self-pay | Admitting: Pulmonary Disease

## 2013-04-25 NOTE — Telephone Encounter (Signed)
[employer] calling [facility].  CPAP D/L with proof of non-compliance in green folder. [employer] would like to know what you would like to do.  Please advise Dr Shelle Ironlance. thanks

## 2013-04-25 NOTE — Telephone Encounter (Signed)
Please call pt and let him know that we have not seen him since starting cpap, and his homecare company is concerned because he is not wearing cpap   If he wishes to continue trying cpap, he needs ov to come in and figure out how we can help with his tolerance.

## 2013-04-26 NOTE — Telephone Encounter (Signed)
Last ov w/ [facility] 8.13.14 LMOM TCB x1 at both the home and cell numbers.

## 2013-04-27 NOTE — Telephone Encounter (Signed)
LMTCBx2 on home and cell number. Jennifer Castillo, CMA  

## 2013-04-28 NOTE — Telephone Encounter (Signed)
lmomtcb x3 on both #'s listed for pt

## 2013-05-01 NOTE — Telephone Encounter (Signed)
Per protocol I will sign off on message. Camden Knotek, CMA  

## 2013-10-11 ENCOUNTER — Encounter: Payer: Self-pay | Admitting: Vascular Surgery

## 2013-10-11 ENCOUNTER — Ambulatory Visit (INDEPENDENT_AMBULATORY_CARE_PROVIDER_SITE_OTHER): Payer: Medicare Other | Admitting: Vascular Surgery

## 2013-10-11 VITALS — BP 146/84 | HR 114 | Temp 97.8°F | Resp 22 | Ht 68.0 in | Wt 306.0 lb

## 2013-10-11 DIAGNOSIS — Z4931 Encounter for adequacy testing for hemodialysis: Secondary | ICD-10-CM

## 2013-10-11 DIAGNOSIS — N186 End stage renal disease: Secondary | ICD-10-CM

## 2013-10-11 NOTE — Progress Notes (Signed)
[facility] [place]  Patient name: Tyler Moore MRN: [id number] DOB: 28-Sep-1970 Sex: male  REASON FOR VISIT: Swelling right upper arm  HPI: Tyler Moore is a 43 y.o. male who has a right upper arm AV fistula which is not used for dialysis. He does peritoneal dialysis. However, one week ago he received an iron injection through his fistula and subsequently felt swelling in the upper arm. He comes in for evaluation. He denies fever or chills.   Past Medical History  Diagnosis Date  . HTN (hypertension)   . Sleep apnea    Family History  Problem Relation Age of Onset  . Cancer Mother     breast  . Hypertension Sister    SOCIAL HISTORY: History  Substance Use Topics  . Smoking status: Never Smoker   . Smokeless tobacco: Never Used  . Alcohol Use: No     Comment: rare   No Known Allergies Current Outpatient Prescriptions  Medication Sig Dispense Refill  . aspirin 81 MG tablet Take 81 mg by mouth daily.      Marland Kitchen b complex-vitamin c-folic acid (NEPHRO-VITE) 0.8 MG TABS Take 0.8 mg by mouth at bedtime.      Marland Kitchen ketoconazole (NIZORAL) 200 MG tablet Take 200 mg by mouth daily.      Marland Kitchen lanthanum (FOSRENOL) 1000 MG chewable tablet Chew 1,000 mg by mouth 2 (two) times daily with a meal.      . losartan (COZAAR) 50 MG tablet Take 50 mg by mouth daily.      . metoprolol tartrate (LOPRESSOR) 25 MG tablet Take 25 mg by mouth 2 (two) times daily.      Marland Kitchen omeprazole (PRILOSEC) 20 MG capsule Take 20 mg by mouth 2 (two) times daily.      . sevelamer (RENVELA) 800 MG tablet Take 800 mg by mouth 3 (three) times daily with meals.       No current facility-administered medications for this visit.   REVIEW OF SYSTEMS: Arly.Keller ] denotes positive finding; [  ] denotes negative finding  CARDIOVASCULAR:  [ ]  chest pain   [ ]  chest pressure   [ ]  palpitations   [ ]  orthopnea   [ ]  dyspnea on exertion   [ ]  claudication   [ ]  rest pain   [ ]  DVT   [ ]  phlebitis PULMONARY:   [ ]   productive cough   [ ]  asthma   [ ]  wheezing NEUROLOGIC:   [ ]  weakness  [ ]  paresthesias  CONSTITUTIONAL:  [ ]  fever   [ ]  chills  PHYSICAL EXAM: Filed Vitals:   10/11/13 1508  BP: 146/84  Pulse: 114  Temp: 97.8 F (36.6 C)  TempSrc: Oral  Resp: 22  Height: 5\' 8"  (1.727 m)  Weight: 306 lb (138.801 kg)  SpO2: 98%   Body mass index is 46.54 kg/(m^2). GENERAL: The patient is a well-nourished male, in no acute distress. The vital signs are documented above. CARDIOVASCULAR: There is a regular rate and rhythm.  PULMONARY: There is good air exchange bilaterally without wheezing or rales. He has a good bruit and thrill in his right upper arm fistula. He does have significant swelling and induration in the mid upper arm likely related to a hematoma. The skin is not compromised.  MEDICAL ISSUES: This patient has likely a hematoma from his IV infusion last week. However I cannot rule out a pseudoaneurysm without obtaining a duplex. He will come back tomorrow for  a duplex of his right upper arm fistula to be sure that he does not have a pseudoaneurysm. If this is simply hematoma this should resolve with time but may take several weeks. If he does have a pseudoaneurysm we would have to consider exploration.  DICKSON,CHRISTOPHER S [facility] [place] [facility]per: [phone]

## 2013-10-12 ENCOUNTER — Encounter: Payer: Self-pay | Admitting: Vascular Surgery

## 2013-10-12 ENCOUNTER — Other Ambulatory Visit: Payer: Self-pay | Admitting: Vascular Surgery

## 2013-10-12 ENCOUNTER — Ambulatory Visit (HOSPITAL_COMMUNITY)
Admission: RE | Admit: 2013-10-12 | Discharge: 2013-10-12 | Disposition: A | Discharge status: Home / Self Care | Payer: Medicare Other | Source: Ambulatory Visit | Attending: Vascular Surgery | Admitting: Vascular Surgery

## 2013-10-12 ENCOUNTER — Ambulatory Visit: Payer: Medicare Other | Admitting: Vascular Surgery

## 2013-10-12 DIAGNOSIS — Z992 Dependence on renal dialysis: Principal | ICD-10-CM

## 2013-10-12 DIAGNOSIS — T82898A Other specified complication of vascular prosthetic devices, implants and grafts, initial encounter: Secondary | ICD-10-CM | POA: Diagnosis not present

## 2013-10-12 DIAGNOSIS — N186 End stage renal disease: Secondary | ICD-10-CM | POA: Diagnosis present

## 2013-10-12 DIAGNOSIS — Z4931 Encounter for adequacy testing for hemodialysis: Secondary | ICD-10-CM | POA: Diagnosis not present

## 2013-10-12 DIAGNOSIS — Y841 Kidney dialysis as the cause of abnormal reaction of the patient, or of later complication, without mention of misadventure at the time of the procedure: Secondary | ICD-10-CM | POA: Insufficient documentation

## 2013-10-12 DIAGNOSIS — T82598A Other mechanical complication of other cardiac and vascular devices and implants, initial encounter: Secondary | ICD-10-CM

## 2013-10-12 NOTE — Progress Notes (Signed)
   Patient name: Tyler Moore MRN: [id number] DOB: 03/21/1971 Sex: male  REASON FOR VISIT: Follow up of hematoma right arm  HPI: Tyler Moore is a 43 y.o. male who I saw yesterday. He has a right upper arm fistula which is not being used for dialysis as he undergoes peritoneal dialysis. However they used his fistula to do a iron infusion and he developed a hematoma. To be sure this was not a pseudoaneurysm he comes back for a duplex. There has been no change in his history.   REVIEW OF SYSTEMS: Arly.Keller[X ] denotes positive finding; [  ] denotes negative finding  CARDIOVASCULAR:  [ ]  chest pain   [ ]  dyspnea on exertion    CONSTITUTIONAL:  [ ]  fever   [ ]  chills  PHYSICAL EXAM: There were no vitals filed for this visit. There is no weight on file to calculate BMI. GENERAL: The patient is a well-nourished male, in no acute distress. The vital signs are documented above. CARDIOVASCULAR: There is a regular rate and rhythm. PULMONARY: There is good air exchange bilaterally without wheezing or rales. His hematoma is unchanged. The fistula is patent.  Duplex scan shows a hematoma adjacent to his fistula but no evidence of pseudoaneurysm.  MEDICAL ISSUES: I reassured the patient that he has a hematoma in the arm that this should resolve with time. There is no compromise of the overlying skin and no evidence of active bleeding. I did explain this may take several weeks to resolve. We will see him back as needed.  DICKSON,CHRISTOPHER S [facility] [place] [facility]per: [phone]

## 2013-12-06 ENCOUNTER — Ambulatory Visit
Admission: RE | Admit: 2013-12-06 | Discharge: 2013-12-06 | Disposition: A | Discharge status: Home / Self Care | Payer: Medicare Other | Source: Ambulatory Visit | Attending: Nephrology | Admitting: Nephrology

## 2013-12-06 ENCOUNTER — Other Ambulatory Visit: Payer: Self-pay | Admitting: Nephrology

## 2013-12-06 DIAGNOSIS — T85611A Breakdown (mechanical) of intraperitoneal dialysis catheter, initial encounter: Secondary | ICD-10-CM

## 2013-12-27 ENCOUNTER — Ambulatory Visit (INDEPENDENT_AMBULATORY_CARE_PROVIDER_SITE_OTHER): Payer: Self-pay | Admitting: General Surgery

## 2013-12-27 NOTE — H&P (Signed)
History of Present Illness Tyler Moore(Tregan Read MD; 12/27/2013 10:54 AM) Patient words: eval pd catheter.  The patient is a 43 year old male who presents with a complaint of non functioning capd catheter. The patient is a 43 year old male referred by Dr. [place]acy [place] for an evaluation of a nonfunctioning CAPD catheter. This states it is placed in 2011 has not had any problems since then. He states that he feels well however does not draining appropriately.  The patient does have a right AV fistula and he will be undergoing hemodialysis intermittently until the CAPD catheter this functioning appropriately. The patient's had no issues with peritonitis.   Other Problems Lamar Laundry(Sonya Bynum, CMA; 12/27/2013 10:42 AM) Chronic Renal Failure Syndrome High blood pressure  Past Surgical History Gilmer Mor(Sonya Bynum, CMA; 12/27/2013 10:42 AM) Dialysis Shunt / Fistula  Diagnostic Studies History Gilmer Mor(Sonya Bynum, CMA; 12/27/2013 10:42 AM) Colonoscopy never  Allergies (Sonya Bynum, CMA; 12/27/2013 10:29 AM) No Known Drug Allergies09/30/2015  Medication History Lamar Laundry(Sonya Bynum, CMA; 12/27/2013 10:31 AM) Losartan Potassium (50MG  Tablet, Oral daily) Active. Metoprolol Tartrate (25MG  Tablet, Oral daily) Active. Omeprazole (20MG  Capsule DR, Oral daily) Active. AmLODIPine Besylate (10MG  Tablet, Oral daily) Active. Aspirin EC (81MG  Tablet DR, Oral daily) Active.  Social History Gilmer Mor(Sonya Bynum, CMA; 12/27/2013 10:42 AM) Alcohol use Occasional alcohol use. Caffeine use Carbonated beverages, Coffee, Tea. No drug use Tobacco use Never smoker.  Family History Gilmer Mor(Sonya Bynum, CMA; 12/27/2013 10:42 AM) Breast Cancer Mother. Hypertension Sister.  Review of Systems Lamar Laundry(Sonya Bynum CMA; 12/27/2013 10:42 AM) General Present- Fatigue. Not Present- Appetite Loss, Chills, Fever, Night Sweats, Weight Gain and Weight Loss. Skin Not Present- Change in Wart/Mole, Dryness, Hives, Jaundice, New Lesions, Non-Healing Wounds, Rash and  Ulcer. HEENT Not Present- Earache, Hearing Loss, Hoarseness, Nose Bleed, Oral Ulcers, Ringing in the Ears, Seasonal Allergies, Sinus Pain, Sore Throat, Visual Disturbances, Wears glasses/contact lenses and Yellow Eyes. Cardiovascular Present- Rapid Heart Rate, Shortness of Breath and Swelling of Extremities. Not Present- Chest Pain, Difficulty Breathing Lying Down, Leg Cramps and Palpitations. Gastrointestinal Present- Abdominal Pain and Bloating. Not Present- Bloody Stool, Change in Bowel Habits, Chronic diarrhea, Constipation, Difficulty Swallowing, Excessive gas, Gets full quickly at meals, Hemorrhoids, Indigestion, Nausea, Rectal Pain and Vomiting.   Vitals (Sonya Bynum CMA; 12/27/2013 10:43 AM) 12/27/2013 10:42 AM Weight: 309 lb Height: 68in Body Surface Area: 2.59 m Body Mass Index: 46.98 kg/m Temp.: 55F(Temporal)  Pulse: 75 (Regular)  BP: 180/90 (Sitting, Left Arm, Standard)    Physical Exam Tyler Moore(Brownie Nehme MD; 12/27/2013 10:55 AM) General Mental Status-Alert. General Appearance-Consistent with stated age. Hydration-Well hydrated. Voice-Normal.  Head and Neck Head-normocephalic, atraumatic with no lesions or palpable masses. Trachea-midline.  Eye Eyeball - Bilateral-Extraocular movements intact. Sclera/Conjunctiva - Bilateral-No scleral icterus.  Chest and Lung Exam Chest and lung exam reveals -quiet, even and easy respiratory effort with no use of accessory muscles and on auscultation, normal breath sounds, no adventitious sounds and normal vocal resonance. Inspection Chest Wall - Normal. Back - normal.  Cardiovascular Cardiovascular examination reveals -normal heart sounds, regular rate and rhythm with no murmurs and normal pedal pulses bilaterally.  Abdomen Inspection Inspection of the abdomen reveals - No Hernias. Skin - Scar - no surgical scars. Incisional scars - Note: Right lower quadrant CAPD  catheter. Palpation/Percussion Palpation and Percussion of the abdomen reveal - Soft, Non Tender, No Rebound tenderness, No Rigidity (guarding) and No hepatosplenomegaly. Auscultation Auscultation of the abdomen reveals - Bowel sounds normal.  Neurologic Neurologic evaluation reveals -alert and oriented x 3 with no impairment of  recent or remote memory. Mental Status-Normal.  Musculoskeletal Normal Exam - Left-Upper Extremity Strength Normal and Lower Extremity Strength Normal. Normal Exam - Right-Upper Extremity Strength Normal and Lower Extremity Strength Normal.    Assessment & Plan Tyler Moore(Sheyann Sulton MD; 12/27/2013 10:59 AM) Neta MendsENCOUNTER FOR CAPD (CONTINUOUS AMBULATORY PERITONEAL DIALYSIS) (V56.8  Z99.2) Impression: Patient is a 43 year old male with a 40 history of a CAPD catheter. Patient states that it's been not exactly inappropriately.  We will proceed to the operative for diagnostic laparoscopy, troubleshooting of the CAPD catheter. Also discussed with him the potential need to be replaced. He agrees with proceeding at this time. Current Plans  Follow up as needed Schedule for Surgery I discussed with the patient the risks and benefits of the procedure to include but not limited to: Infection, bleeding,damage to structures, possible need for placement of CAPD catheter, possible continued dysfunction of CAPD catheter. The patient voiced understanding and wishes to proceed.

## 2014-01-05 ENCOUNTER — Encounter (HOSPITAL_COMMUNITY): Payer: Self-pay | Admitting: *Deleted

## 2014-01-05 ENCOUNTER — Encounter (HOSPITAL_COMMUNITY): Payer: Self-pay | Admitting: Pharmacy Technician

## 2014-01-07 MED ORDER — CHLORHEXIDINE GLUCONATE 4 % EX LIQD
1.0000 "application " | Freq: Once | CUTANEOUS | Status: DC
  Filled 2014-01-07: qty 15

## 2014-01-07 MED ORDER — DEXTROSE 5 % IV SOLN
3.0000 g | INTRAVENOUS | Status: AC
  Administered 2014-01-08: 3 g via INTRAVENOUS
  Filled 2014-01-07: qty 3000

## 2014-01-08 ENCOUNTER — Ambulatory Visit (HOSPITAL_COMMUNITY): Payer: Medicare Other | Admitting: Certified Registered"

## 2014-01-08 ENCOUNTER — Ambulatory Visit (HOSPITAL_COMMUNITY): Payer: Medicare Other

## 2014-01-08 ENCOUNTER — Ambulatory Visit (HOSPITAL_COMMUNITY)
Admission: RE | Admit: 2014-01-08 | Discharge: 2014-01-08 | Disposition: A | Discharge status: Home / Self Care | Payer: Medicare Other | Source: Ambulatory Visit | Attending: General Surgery | Admitting: General Surgery

## 2014-01-08 ENCOUNTER — Encounter (HOSPITAL_COMMUNITY)
Admission: RE | Disposition: A | Discharge status: Home / Self Care | Payer: Medicare Other | Source: Ambulatory Visit | Attending: General Surgery

## 2014-01-08 ENCOUNTER — Encounter (HOSPITAL_COMMUNITY): Payer: Medicare Other | Admitting: Certified Registered"

## 2014-01-08 ENCOUNTER — Encounter (HOSPITAL_COMMUNITY): Payer: Self-pay | Admitting: Certified Registered"

## 2014-01-08 DIAGNOSIS — Y838 Other surgical procedures as the cause of abnormal reaction of the patient, or of later complication, without mention of misadventure at the time of the procedure: Secondary | ICD-10-CM | POA: Diagnosis not present

## 2014-01-08 DIAGNOSIS — Z79899 Other long term (current) drug therapy: Secondary | ICD-10-CM | POA: Diagnosis not present

## 2014-01-08 DIAGNOSIS — I10 Essential (primary) hypertension: Secondary | ICD-10-CM | POA: Insufficient documentation

## 2014-01-08 DIAGNOSIS — G4730 Sleep apnea, unspecified: Secondary | ICD-10-CM | POA: Diagnosis not present

## 2014-01-08 DIAGNOSIS — T8589XA Other specified complication of internal prosthetic devices, implants and grafts, not elsewhere classified, initial encounter: Secondary | ICD-10-CM | POA: Insufficient documentation

## 2014-01-08 DIAGNOSIS — I129 Hypertensive chronic kidney disease with stage 1 through stage 4 chronic kidney disease, or unspecified chronic kidney disease: Secondary | ICD-10-CM | POA: Diagnosis not present

## 2014-01-08 DIAGNOSIS — Y92239 Unspecified place in hospital as the place of occurrence of the external cause: Secondary | ICD-10-CM | POA: Diagnosis not present

## 2014-01-08 DIAGNOSIS — N189 Chronic kidney disease, unspecified: Secondary | ICD-10-CM | POA: Diagnosis not present

## 2014-01-08 DIAGNOSIS — Z7982 Long term (current) use of aspirin: Secondary | ICD-10-CM | POA: Insufficient documentation

## 2014-01-08 DIAGNOSIS — Z01810 Encounter for preprocedural cardiovascular examination: Secondary | ICD-10-CM

## 2014-01-08 HISTORY — DX: Pneumonia, unspecified organism: J18.9

## 2014-01-08 HISTORY — PX: CAPD INSERTION: SHX5233

## 2014-01-08 HISTORY — DX: Chronic kidney disease, unspecified: N18.9

## 2014-01-08 LAB — POCT I-STAT 4, (NA,K, GLUC, HGB,HCT)
Glucose, Bld: 100 mg/dL — ABNORMAL HIGH (ref 70–99)
HCT: 27 % — ABNORMAL LOW (ref 39.0–52.0)
Hemoglobin: 9.2 g/dL — ABNORMAL LOW (ref 13.0–17.0)
POTASSIUM: 4.5 meq/L (ref 3.7–5.3)
SODIUM: 141 meq/L (ref 137–147)

## 2014-01-08 SURGERY — LAPAROSCOPIC INSERTION CONTINUOUS AMBULATORY PERITONEAL DIALYSIS  (CAPD) CATHETER
Anesthesia: General | Site: Abdomen

## 2014-01-08 MED ORDER — PROPOFOL 10 MG/ML IV BOLUS
INTRAVENOUS | Status: DC | PRN
  Administered 2014-01-08: 180 mg via INTRAVENOUS

## 2014-01-08 MED ORDER — ARTIFICIAL TEARS OP OINT
TOPICAL_OINTMENT | OPHTHALMIC | Status: DC | PRN
  Administered 2014-01-08: 1 via OPHTHALMIC

## 2014-01-08 MED ORDER — PHENYLEPHRINE 40 MCG/ML (10ML) SYRINGE FOR IV PUSH (FOR BLOOD PRESSURE SUPPORT)
PREFILLED_SYRINGE | INTRAVENOUS | Status: AC
  Filled 2014-01-08: qty 10

## 2014-01-08 MED ORDER — CISATRACURIUM BESYLATE (PF) 10 MG/5ML IV SOLN
INTRAVENOUS | Status: DC | PRN
  Administered 2014-01-08: 4 mg via INTRAVENOUS
  Administered 2014-01-08: 2 mg via INTRAVENOUS

## 2014-01-08 MED ORDER — LIDOCAINE HCL (CARDIAC) 20 MG/ML IV SOLN
INTRAVENOUS | Status: AC
  Filled 2014-01-08: qty 5

## 2014-01-08 MED ORDER — OXYCODONE HCL 5 MG/5ML PO SOLN: 5.0000 mg | Freq: Once | ORAL | Status: DC | PRN

## 2014-01-08 MED ORDER — FENTANYL CITRATE 0.05 MG/ML IJ SOLN
INTRAMUSCULAR | Status: AC
  Filled 2014-01-08: qty 5

## 2014-01-08 MED ORDER — MIDAZOLAM HCL 5 MG/5ML IJ SOLN
INTRAMUSCULAR | Status: DC | PRN
  Administered 2014-01-08: 2 mg via INTRAVENOUS

## 2014-01-08 MED ORDER — OXYCODONE HCL 5 MG PO TABS: 5.0000 mg | ORAL_TABLET | ORAL | Status: DC | PRN

## 2014-01-08 MED ORDER — OXYCODONE-ACETAMINOPHEN 5-325 MG PO TABS: 1.0000 | ORAL_TABLET | ORAL | Status: DC | PRN

## 2014-01-08 MED ORDER — GLYCOPYRROLATE 0.2 MG/ML IJ SOLN
INTRAMUSCULAR | Status: AC
  Filled 2014-01-08: qty 4

## 2014-01-08 MED ORDER — FENTANYL CITRATE 0.05 MG/ML IJ SOLN
INTRAMUSCULAR | Status: DC | PRN
  Administered 2014-01-08: 75 ug via INTRAVENOUS

## 2014-01-08 MED ORDER — ARTIFICIAL TEARS OP OINT
TOPICAL_OINTMENT | OPHTHALMIC | Status: AC
  Filled 2014-01-08: qty 3.5

## 2014-01-08 MED ORDER — BUPIVACAINE HCL 0.25 % IJ SOLN
INTRAMUSCULAR | Status: DC | PRN
  Administered 2014-01-08: 5 mL

## 2014-01-08 MED ORDER — 0.9 % SODIUM CHLORIDE (POUR BTL) OPTIME
TOPICAL | Status: DC | PRN
  Administered 2014-01-08: 1000 mL

## 2014-01-08 MED ORDER — PROPOFOL 10 MG/ML IV BOLUS
INTRAVENOUS | Status: AC
  Filled 2014-01-08: qty 20

## 2014-01-08 MED ORDER — LIDOCAINE HCL (CARDIAC) 20 MG/ML IV SOLN
INTRAVENOUS | Status: DC | PRN
  Administered 2014-01-08: 70 mg via INTRAVENOUS

## 2014-01-08 MED ORDER — OXYCODONE HCL 5 MG PO TABS: 5.0000 mg | ORAL_TABLET | Freq: Once | ORAL | Status: DC | PRN

## 2014-01-08 MED ORDER — SUCCINYLCHOLINE CHLORIDE 20 MG/ML IJ SOLN
INTRAMUSCULAR | Status: DC | PRN
  Administered 2014-01-08: 140 mg via INTRAVENOUS

## 2014-01-08 MED ORDER — PHENYLEPHRINE HCL 10 MG/ML IJ SOLN
INTRAMUSCULAR | Status: DC | PRN
  Administered 2014-01-08 (×5): 120 ug via INTRAVENOUS
  Administered 2014-01-08: 80 ug via INTRAVENOUS
  Administered 2014-01-08: 120 ug via INTRAVENOUS

## 2014-01-08 MED ORDER — ONDANSETRON HCL 4 MG/2ML IJ SOLN: 4.0000 mg | Freq: Four times a day (QID) | INTRAMUSCULAR | Status: DC | PRN

## 2014-01-08 MED ORDER — SUCCINYLCHOLINE CHLORIDE 20 MG/ML IJ SOLN
INTRAMUSCULAR | Status: AC
  Filled 2014-01-08: qty 1

## 2014-01-08 MED ORDER — GLYCOPYRROLATE 0.2 MG/ML IJ SOLN
INTRAMUSCULAR | Status: DC | PRN
  Administered 2014-01-08: .8 mg via INTRAVENOUS

## 2014-01-08 MED ORDER — BUPIVACAINE-EPINEPHRINE (PF) 0.25% -1:200000 IJ SOLN
INTRAMUSCULAR | Status: AC
  Filled 2014-01-08: qty 30

## 2014-01-08 MED ORDER — SODIUM CHLORIDE 0.9 % IV SOLN
INTRAVENOUS | Status: DC | PRN
  Administered 2014-01-08: 08:00:00 via INTRAVENOUS

## 2014-01-08 MED ORDER — SODIUM CHLORIDE 0.9 % IR SOLN
Status: DC | PRN
  Administered 2014-01-08: 1000 mL

## 2014-01-08 MED ORDER — EPHEDRINE SULFATE 50 MG/ML IJ SOLN
INTRAMUSCULAR | Status: AC
  Filled 2014-01-08: qty 1

## 2014-01-08 MED ORDER — MIDAZOLAM HCL 2 MG/2ML IJ SOLN
INTRAMUSCULAR | Status: AC
  Filled 2014-01-08: qty 2

## 2014-01-08 MED ORDER — ONDANSETRON HCL 4 MG/2ML IJ SOLN
INTRAMUSCULAR | Status: DC | PRN
  Administered 2014-01-08: 4 mg via INTRAVENOUS

## 2014-01-08 MED ORDER — NEOSTIGMINE METHYLSULFATE 10 MG/10ML IV SOLN
INTRAVENOUS | Status: AC
  Filled 2014-01-08: qty 1

## 2014-01-08 MED ORDER — NEOSTIGMINE METHYLSULFATE 10 MG/10ML IV SOLN
INTRAVENOUS | Status: DC | PRN
  Administered 2014-01-08: 5 mg via INTRAVENOUS

## 2014-01-08 MED ORDER — ONDANSETRON HCL 4 MG/2ML IJ SOLN
INTRAMUSCULAR | Status: AC
  Filled 2014-01-08: qty 2

## 2014-01-08 MED ORDER — FENTANYL CITRATE 0.05 MG/ML IJ SOLN: 25.0000 ug | INTRAMUSCULAR | Status: DC | PRN

## 2014-01-08 SURGICAL SUPPLY — 50 items
ADAPTER TITANIUM MEDIONICS (MISCELLANEOUS) ×2 IMPLANT
BENZOIN TINCTURE PRP APPL 2/3 (GAUZE/BANDAGES/DRESSINGS) ×2 IMPLANT
BLADE SURG ROTATE 9660 (MISCELLANEOUS) ×2 IMPLANT
CANISTER SUCTION 2500CC (MISCELLANEOUS) IMPLANT
CATH EXTENDED DIALYSIS (CATHETERS) IMPLANT
CHLORAPREP W/TINT 26ML (MISCELLANEOUS) ×2 IMPLANT
COVER SURGICAL LIGHT HANDLE (MISCELLANEOUS) ×2 IMPLANT
DERMABOND ADVANCED (GAUZE/BANDAGES/DRESSINGS) ×1
DERMABOND ADVANCED .7 DNX12 (GAUZE/BANDAGES/DRESSINGS) ×1 IMPLANT
DEVICE TROCAR PUNCTURE CLOSURE (ENDOMECHANICALS) ×2 IMPLANT
DISSECTOR BLUNT TIP ENDO 5MM (MISCELLANEOUS) IMPLANT
DRAPE UTILITY 15X26 W/TAPE STR (DRAPE) IMPLANT
ELECT REM PT RETURN 9FT ADLT (ELECTROSURGICAL) ×2
ELECTRODE REM PT RTRN 9FT ADLT (ELECTROSURGICAL) ×1 IMPLANT
GAUZE SPONGE 2X2 8PLY STRL LF (GAUZE/BANDAGES/DRESSINGS) ×1 IMPLANT
GLOVE BIO SURGEON STRL SZ 6.5 (GLOVE) ×2 IMPLANT
GLOVE BIO SURGEON STRL SZ7.5 (GLOVE) ×2 IMPLANT
GLOVE BIOGEL PI IND STRL 7.0 (GLOVE) ×1 IMPLANT
GLOVE BIOGEL PI IND STRL 7.5 (GLOVE) ×1 IMPLANT
GLOVE BIOGEL PI INDICATOR 7.0 (GLOVE) ×1
GLOVE BIOGEL PI INDICATOR 7.5 (GLOVE) ×1
GLOVE ECLIPSE 7.5 STRL STRAW (GLOVE) ×4 IMPLANT
GOWN STRL REUS W/ TWL LRG LVL3 (GOWN DISPOSABLE) ×4 IMPLANT
GOWN STRL REUS W/ TWL XL LVL3 (GOWN DISPOSABLE) ×1 IMPLANT
GOWN STRL REUS W/TWL LRG LVL3 (GOWN DISPOSABLE) ×4
GOWN STRL REUS W/TWL XL LVL3 (GOWN DISPOSABLE) ×1
KIT BASIN OR (CUSTOM PROCEDURE TRAY) ×2 IMPLANT
KIT ROOM TURNOVER OR (KITS) ×2 IMPLANT
NEEDLE INSUFFLATION 14GA 120MM (NEEDLE) ×2 IMPLANT
NS IRRIG 1000ML POUR BTL (IV SOLUTION) ×2 IMPLANT
PAD ARMBOARD 7.5X6 YLW CONV (MISCELLANEOUS) ×4 IMPLANT
SCISSORS LAP 5X35 DISP (ENDOMECHANICALS) ×2 IMPLANT
SET EXT 12IN DIALYSIS STAY-SAF (MISCELLANEOUS) ×2 IMPLANT
SET IRRIG TUBING LAPAROSCOPIC (IRRIGATION / IRRIGATOR) IMPLANT
SLEEVE ENDOPATH XCEL 5M (ENDOMECHANICALS) ×2 IMPLANT
SPONGE GAUZE 2X2 STER 10/PKG (GAUZE/BANDAGES/DRESSINGS) ×1
STRIP CLOSURE SKIN 1/2X4 (GAUZE/BANDAGES/DRESSINGS) ×2 IMPLANT
STYLET FALLER (MISCELLANEOUS) IMPLANT
STYLET FALLER MEDIONICS (MISCELLANEOUS) IMPLANT
SUT MNCRL AB 4-0 PS2 18 (SUTURE) ×2 IMPLANT
SUT PROLENE 2 0 CT2 30 (SUTURE) ×6 IMPLANT
SUT PROLENE 2 0 KS (SUTURE) ×2 IMPLANT
SUT SILK 3 0 SH 30 (SUTURE) IMPLANT
TAPE CLOTH SURG 4X10 WHT LF (GAUZE/BANDAGES/DRESSINGS) ×2 IMPLANT
TOWEL OR 17X26 10 PK STRL BLUE (TOWEL DISPOSABLE) ×2 IMPLANT
TRAY LAPAROSCOPIC (CUSTOM PROCEDURE TRAY) ×2 IMPLANT
TROCAR 5MMX150MM (TROCAR) ×2 IMPLANT
TROCAR XCEL NON-BLD 5MMX100MML (ENDOMECHANICALS) ×2 IMPLANT
TUBING CYSTO DISP (UROLOGICAL SUPPLIES) ×2 IMPLANT
TUBING INSUFFLATION (TUBING) ×2 IMPLANT

## 2014-01-08 NOTE — Anesthesia Preprocedure Evaluation (Addendum)
Anesthesia Evaluation  Patient identified by MRN, date of birth, ID band Patient awake    Reviewed: Allergy & Precautions, H&P , NPO status , Patient's Chart, lab work & pertinent test results, reviewed documented beta blocker date and time   Airway Mallampati: II  Neck ROM: Limited    Dental  (+) Teeth Intact, Dental Advisory Given   Pulmonary sleep apnea ,          Cardiovascular hypertension, Pt. on medications and Pt. on home beta blockers     Neuro/Psych    GI/Hepatic   Endo/Other  Morbid obesity  Renal/GU ESRF and DialysisRenal disease     Musculoskeletal   Abdominal   Peds  Hematology   Anesthesia Other Findings   Reproductive/Obstetrics                          Anesthesia Physical Anesthesia Plan  ASA: III  Anesthesia Plan: General   Post-op Pain Management:    Induction: Intravenous  Airway Management Planned: Oral ETT  Additional Equipment: None  Intra-op Plan:   Post-operative Plan: Extubation in OR  Informed Consent: I have reviewed the patients History and Physical, chart, labs and discussed the procedure including the risks, benefits and alternatives for the proposed anesthesia with the patient or authorized representative who has indicated his/her understanding and acceptance.   Dental advisory given  Plan Discussed with: CRNA, Anesthesiologist and Surgeon  Anesthesia Plan Comments:        Anesthesia Quick Evaluation

## 2014-01-08 NOTE — Op Note (Signed)
01/08/2014  8:34 AM  PATIENT:  Tyler Moore  43 y.o. male  PRE-OPERATIVE DIAGNOSIS:  EVALUATE CAPD CATH, CHRONIC RENAL FAILURE SYNDROME  POST-OPERATIVE DIAGNOSIS:  EVALUATE CONTINUOUS AMBULATORY PERITONEAL DIALYSIS CATHETER, CHRONIC RENAL FAILURE SYNDROME  PROCEDURE:  Procedure(s): DIAGNOSTIC LAPAROSCOPY,LYSIS OF ADHESIONS, REPOSITIONING OF CAPD CATHETER SURGEON:  Surgeon(s) and Role:    * Axel FillerArmando Chandel Zaun, MD - Primary  PHYSICIAN ASSISTANT: NONE  ASSISTANTS: none   ANESTHESIA:   local and general  EBL:     BLOOD ADMINISTERED:none  DRAINS: CAPD CATHETER   LOCAL MEDICATIONS USED:  BUPIVICAINE   SPECIMEN:  No Specimen  DISPOSITION OF SPECIMEN:  N/A  COUNTS:  YES  TOURNIQUET:  * No tourniquets in log *  DICTATION: .Dragon Dictation  After the patient was taken back to the OR Placed in the supine position with bilateral SCDs in place. After appropriate antibiotics were confirmed all facts were verified.  He was prepped and draped in the usual sterile fashion, the catheter was also prepped.  A veress needle was And introduced into the abdomen. There was no injury to any intra-abdominal organs. There was a large amount of adhesions of the intra-abdominal organs to the anterior abdominal wall. There was a clear spot in the right abdomen. A 5mm trocar was placed under direct visualization and there was no inury to any intraabdominal organ. The CAPD catheter was visualized coming into the abdomen and  heading towards the pelvis. This portion of the catheter seemed to be encased in adhesions. At this time these were lysed sharply without electrocautery. This Freed up this portion of the catheter. The catheter was free. This also was seen going into the pelvis without any surrounding adhesions.  At this time I injected sterile saline into the catheter. This freely flowed.. At this time 500cc of saline was introduced into the catheter which ws repositioned into the pelvis. After  positioning the catheter into the pelvis this was really evacuated with gravity.  At this time to secure the catheter into the pelvis I proceeded to place a 2-0 Prolene via a Keith needle and an Endo Close device to secure the catheter to the anterior abdominal wall. This was done via a stab incision of the anterior abdominal wall.  The anterior portion of the catheter was secured appropriately using the new titanium device. The peritoneum was evacuated all trochars removed. Subcutaneous tissue was reapproximated using 4-0 Monocryl subcuticular fashion. The Patient tolerated procedure well.  PLAN OF CARE: Discharge to home after PACU  PATIENT DISPOSITION:  PACU - hemodynamically stable.   Delay start of Pharmacological VTE agent (>24hrs) due to surgical blood loss or risk of bleeding: not applicable

## 2014-01-08 NOTE — Discharge Instructions (Signed)
PERITONEAL DIALYSIS (CAPD) CATHETER PLACEMENT: ° °POST OPERATIVE INSTRUCTIONS ° °FOLLOW UP with the Peritoneal Dialysis Nurses ° [address] [place], [place] [address] ° °Call [phone] or your neprologist to help arrange training/flushes of your CAPD catheter °  °The CAPD nurses & Nephrology usually follow you closely, making the need for follow-up in the [facility] surgery office redundant and therefore not always needed.  If they or you have concerns, please call us for possible follow-up in our office ° °1. Do NOT shower until dialysis nursing staff have advised.  Do NOT submerge in a bathtub or hot tub. °2. Your Home Therapy RN will advise and educate you on showering, bathing, and swimming when you are in training. °3. The Peritoneal Dialysis nurse will remove your waterproof bandages in the Dialysis Center a few days after surgery.  Do not remove the bandages until seen by them.  If you dressing becomes wet, saturated, or falls off call your Home Therapy RN. °4. ACTIVITIES as tolerated:   °a. You may resume regular (light) daily activities beginning the next day--such as daily self-care, walking, climbing stairs--gradually increasing activities as tolerated.  If you can walk 30 minutes without difficulty, it is safe to try more intense activity such as jogging, treadmill, bicycling, low-impact aerobics,  etc. °b. No swimming within the 1st month of catheter placement.  You must have a Dr's order. °c. Save the most intensive and strenuous activity for last such as sit-ups, heavy lifting, contact sports, etc  Refrain from any heavy lifting or straining until you are off narcotics for pain control.   °d. DO NOT PUSH THROUGH PAIN.  Let pain be your guide: If it hurts to do something, don't do it.  Pain is your body warning you to avoid that activity for another week until the pain goes down. °e. You may drive when you are no longer taking prescription pain medication, you can comfortably wear a seatbelt, and you can  safely maneuver your car and apply brakes. °f. You may have sexual intercourse when it is comfortable.  °g. Be sure your catheter is taped to Your abdomen nor injured. Did not allow catheter to ankle, or tension on the catheter-it may cause damage to skin over the catheter °h. You will be instructed on what to do an emergency, and will have access to on call our in 24 hours a day. The number to reach the home therapy nurse as listed below. °5. DIET: Follow a light bland diet the first 24 hours after arrival home, such as soup, liquids, crackers, etc.  Be sure to include lots of fluids daily.  Avoid fast food or heavy meals as your are more likely to get nauseated.   °6. Take your usually prescribed home medications unless otherwise directed. °7. PAIN CONTROL: °a. Pain is best controlled by a usual combination of three different methods TOGETHER: °i. Ice/Heat °ii. Tylenol (over the counter pain medication) °iii. Prescription pain medication °b. Most patients will experience some swelling and bruising around the incisions.  Ice packs or heating pads (30-60 minutes up to 6 times a day) will help. Use ice for the first few days to help decrease swelling and bruising, then switch to heat to help relax tight/sore spots and speed recovery.  Some people prefer to use ice alone, heat alone, alternating between ice & heat.  Experiment to what works for you.  Swelling and bruising can take several weeks to resolve.   °c. It is helpful to take   an over-the-counter pain medication regularly for the first few weeks.  Using acetaminophen (Tylenol, etc) 500-650mg four times a day (every meal & bedtime) is usually safest since NSAIDs are not advisable in patients with kidney disease. °d. A  prescription for pain medication (such as oxycodone, hydrocodone, etc) should be given to you upon discharge.  Take your pain medication as prescribed.  °i. If you are having problems/concerns with the prescription medicine (does not control pain,  nausea, vomiting, rash, itching, etc), please call us [phone] to see if we need to switch you to a different pain medicine that will work better for you and/or control your side effect better. °ii. If you need a refill on your pain medication, please contact your pharmacy.  They will contact our office to request authorization. Prescriptions will not be filled after 5 pm or on week-ends. °8. Avoid getting constipated.  Between the surgery and the pain medications, it is common to experience some constipation.  Increasing fluid intake and taking a fiber supplement (such as Metamucil, Citrucel, FiberCon, MiraLax, etc) 1-2 times a day regularly will usually help prevent this problem from occurring.  A mild laxative (prune juice, Milk of Magnesia, MiraLax, etc) should be taken according to package directions if there are no bowel movements after 48 hours.  ° ° ° ° ° °FOLLOW UP: °Peritoneal Dialysis nurses ° [address] [place], [place] [address] ° °Call [phone] or your neprologist to help arrange training/flushes of your CAPD catheter °  ° -The CAPD nurses & Nephrology usually follow you closely, making the need for follow-up in our office redundant and therefore not needed.  If they or you have concerns, please call us for possible follow-up in our office ° ° -Please call [facility] at [phone] only as needed. ° °WHEN TO CALL US [phone]: °1. Poor pain control °2. Reactions / problems with new medications (rash/itching, nausea, etc)  °3. Fever over 101.5 F (38.5 C) °4. Worsening swelling or bruising °5. Continued bleeding from incision. °6. Increased pain, redness, or drainage from the incision ° ° The clinic staff is available to answer your questions during regular business hours (8:30am-5pm).  Please don’t hesitate to call and ask to speak to one of our nurses for clinical concerns.  ° If you have a medical emergency, go to the nearest emergency room or call 911. ° A surgeon from [facility] is always on call at the hospitals ° °9. IF YOU HAVE DISABILITY OR FAMILY LEAVE FORMS, BRING THEM TO THE OFFICE FOR PROCESSING.  DO NOT GIVE THEM TO YOUR DOCTOR. ° °[facility] °[address], [address], [place], [place]  [address] ? °MAIN: [phone] ? TOLL FREE: [phone] ?  °FAX [phone] °[email or web address] ° °Peritoneal Dialysis - An Overview °Dialysis can be done using a machine outside of the body (hemodialysis). Or, it can be done inside the body (peritoneal dialysis). The word "peritoneal" refers to the lining or membrane of the belly (abdominal cavity). The peritoneal membrane is a thin, plastic-like lining inside the belly that covers the organs and fits in the abdominal or peritoneal cavity, such as the stomach, liver and the kidneys. This lining works like a filter. It will allow certain things to pass from your blood through the lining and into a special solution that has been placed into your belly. In this type of dialysis, the peritoneum is used to help clean the blood.  °If you need dialysis, your   kidneys are not working right. Healthy kidneys take out extra water and waste products, which becomes urine. When the kidneys do not do this, serious problems can develop. The waste and water build up in the blood. Your hands and feet might swell. You may feel tired, weak or sick to your stomach. Also, your blood pressure may rise. If not treated, you could die. Dialysis is a treatment that does the work that your kidneys would do if they were healthy. °· It cleans your blood.  °· It will make sure your body has the right amount of certain chemicals that it needs. They include potassium, sodium and bicarbonate.  °· It will help control your blood pressure.  °UNDERSTANDING PERITONEAL DIALYSIS °· Here is how peritoneal dialysis works:  °· First, you will have surgery to put a soft plastic tube (catheter) into your belly (abdomen). This will allow you  to easily connect yourself to special tubing, which will then let a special dialysis solution to be placed into your abdomen.  °· For each treatment, you will need at least one bag of dialysis solution (a liquid called dialysate). It is a mix of water that is pure and free of germs (sterile), sugar (dextrose) and the nutrients and minerals found in your blood. Sometimes, more than one bag is needed to get the right amount of fluid for your abdomen. Your caregiver will explain what size and how many bags you will need.  °· The dialysate is slowly put through the catheter to fill the abdomen (called the peritoneal cavity). This dialysate will need to stay in your body for 3-4 hours. This is known as the dwell time.  °· The solution is working to clean the blood and remove wastes from your body. At the end of this time, the solution is drained from your body through tubing into an empty bag. It is then replaced with a fresh dialysate.  °· The draining and replacing of the dialysate is called an exchange or cycle. The catheter is capped after each exchange. Once the solution is in your body, you are then free to do whatever you would like until the next exchange. Most people will need to do 4-5 exchanges each day.  °· There are two different methods that can be used.  °· Continuous ambulatory peritoneal dialysis (CAPD): You put the solution into your abdomen, cap your catheter and then go about your day. Several hours later, you reconnect to a tubing set up, drain out the solution and then put more solution in. This is done several times a day. No machine is needed.  °· Continuous cycler-assisted peritoneal dialysis (CCPD): A machine is used, which fills the abdomen with dialysate and then drains it. This happens several times. It usually is done at night while you are sleeping. When you wake up, you can disconnect from the machine and are free to go to go about your day.  °PREPARING FOR EXCHANGES °· Discuss the details  of the procedure with your caregivers. You will be working with a nurse who is specially trained in doing dialysis. Make sure you understand:  °· How to do an exchange.  °· How much solution you need.  °· What type of solution you will need.  °· How often you should do an exchange. Ask:  °· How many times each day?  °· When? At meals? At bedtime?  °· Always keep the dialysate bags and other supplies in a cool, clean and dry place.  °·   Keeping everything clean is very important.  °· The catheter and its cap must be free from germs (sterile)  °· The adapter also must be sterile. It attaches the dialysis bag and tubing to the catheter.  °· Clean the area of your body around the catheter every day. Use a chemical that fights infection (antiseptic).  °· Wash your hands thoroughly before starting an exchange.  °· You may be taught to wear a mask to cover your nose and mouth. This makes infection less likely to happen.  °· You may be taught to close doors, windows and turn off any fans before doing an exchange.  °· Check the dialysate bag very carefully.  °· Make sure it is the right size bag for you. This information is on the label.  °· Also, make sure it is the right mixture. For some people, the dialysate contents vary. For instance, the mixture might be a stronger solution for overnight.  °· Check the expiration date (the last date you can use the bag). It also is on the label. If the date has gone by, throw away the bag.  °· The solution should be clear. You should be able to see any writing on the side of the bag clearly through the solution. Do not use a cloudy solution.  °· Gently squeeze the bag to make sure there are no leaks.  °· Use a dry heating pad to warm the dialysate in the bag. Leave the cover on the bag while you do this.  °· This is for comfort. You can skip this step if you want.  °· Never place the bag of solution under warm or hot water. Water from a faucet is not sterile and could cause germs to  get into the bag. Infection could then result.  °PERFORMING AN EXCHANGE °· For continuous ambulatory dialysis:  °· Attach the dialysis bag and tubing to your catheter. Hang the bag so that gravity (the natural downward pull) draws the solution down and into your abdomen once the clamps are opened. This should take about 10 minutes.  °· Remove the bag and tubing from the catheter. Cap the catheter.  °· The solution stays in the abdomen for 3-4 hours (dwell time). The solution is working to clean the blood and remove wastes from your body.  °· When you are ready to drain the solution for another exchange, take the cap off the catheter. Then, attach the catheter to tubing, which is attached to an empty bag. Place this empty bag below the abdomen or on the floor or stool and undo the clamps.  °· Gravity helps pull the fluid out of the abdomen and into the bag. The fluid in the bag may look yellow and clear, like urine. It usually takes about 20 minutes to drain the fluid out of the abdomen.  °· When the solution has drained, start the process again by infusing a new bag of dialysate and then capping the catheter.  °· This should continue until you have used all of the solution that you are to use each day.  °· Sometimes, a small machine is used overnight. It is called a mini-cycler. This is done if the body cannot go all night without an exchange. The machine lets you sleep without having to get up and do an exchange.  °· For continuous cycler-assisted dialysis:  °· You will be taught how to set up or program your machine.  °· When you are ready for bed,   put the dialysate bags onto the cycler machine. Put on exactly the number of bags that your caregiver said to use.  °· Connect your catheter to the machine and turn the cycler machine on.  °· Overnight, the cycler will do several exchanges. It often does three to five, sometimes more.  °· Solution that is in your abdomen in the morning will stay during the day. The  machine is set to make the daytime solution stronger, if that is needed.  °· In the morning, you will disconnect from the machine and cap your catheter and go about your day.  °· Sometimes, an extra exchange is done during the day. This may be needed to remove excess waste or fluid.  °IMPORTANT REMINDERS °· You will need to follow a very strict schedule. Every step of the dialysis procedure must be done every day. Sometimes, several times a day. Altogether, this might take an extra 2 hours or more. However, you must stick to the routine. Do not skip a day. Do not skip a procedure.  °· Some people find it helpful to work with a counselor or social worker in addition to the renal (kidney) nurse. They can help you figure out how to change your daily routine to fit in the dialysis sessions.  °· You may need to change your diet. Ask your caregiver for advice, or talk with a nutritionist about what you should and should not eat.  °· You will need to weigh yourself every day and keep track of what your weight is.  °· You may be taught how to check your blood pressure before every exchange. Your blood pressure reading will help determine what type of solution to use. If your blood pressure is too high, you may need a stronger solution.  °RISKS AND COMPLICATIONS  °Possible problems vary, depending on the method you use. Your overall health also can have an effect. Problems that could develop because of dialysis include: °· Infection. This is the most common problem. It could occur:  °· In the peritoneum. This is called peritonitis.  °· Around the catheter.  °· Weight gain. The dialysate contains a type of sugar known as dextrose. Dextrose has a lot of calories. The body takes in several hundred calories from this sugar each day.  °· Weakened muscles in the abdomen. This can result from all of the fluid that your body has to hold in the abdomen.  °· Catheter replacement. Sometimes, a new one has to be put in.  °· Change in  dialysis method. Due to some complications, you may need to change to hemodialysis for a short time and have your dialysis done at a center.  °· Trouble adjusting to your new lifestyle. In some people, this leads to depression.  °· Sleep problems.  °· Dialysis-related amyloidosis. This sometimes occurs after 5 years of dialysis. Protein builds up in the blood. This can cause painful deposits on bones, joints and tendons (which connect muscle to bone). Or, it can cause hollow spots in bones that make them more likely to break.  °· Excess fluid. Your body may absorb too much of the fluid that is held in the abdomen. This can lead to heart or lung problems.  °SEEK MEDICAL CARE IF:  °· You have any problems with an exchange.  °· The area around the catheter becomes red or painful.  °· The catheter seems loose, or it feels like it is coming out.  °· A bag of dialysate looks   cloudy. Or, the liquid is an unusual color.  °· Abdominal pain or discomfort.  °· You feel sick to your stomach (nauseous) or throw up (vomit).  °· You develop a fever of more than 102° F (38.9° C).  °SEEK IMMEDIATE MEDICAL CARE IF:  °You develop a fever of more than 102° F (38.9° C). °Document Released: 01/11/2009 Document Revised: 03/05/2011 Document Reviewed: 01/11/2009 °ExitCare® Patient Information ©2012 [employer]. ° °Diet for Peritoneal Dialysis °This diet may be modified in protein, sodium, phosphorus, potassium, or fluid, depending on your needs. The goals of nutrition therapy are similar to those for patients on hemodialysis. Providing enough protein to replace peritoneal losses is a priority. °USES OF THIS DIET °The diet is designed for the patient with end-stage kidney (renal) disease, who is treated by peritoneal dialysis. Treatment options include: °· Continuous Ambulatory Peritoneal Dialysis (CAPD): Usually 4 exchanges of 1.5 to 2 liter volumes of glucose (sugar) and electrolyte-containing dialysate.  °· Continuous Cyclic Peritoneal  Dialysis (CCPD): Essentially a reversal of CAPD, with shorter exchanges at night and a longer one during the day.  °· Intermittent Peritoneal Dialysis (IPD): 10 to 12 hours of exchanges, 2 to 3 times weekly.  °ADEQUACY °The diet may not meet the Recommended Dietary Allowances of the National Research Council for calcium and ascorbic acid. Protein and water-soluble vitamin needs may be increased because of losses into the dialysate. Recommended daily supplements are the same as for hemodialysis patients. °ASSESSMENT/DETERMINATION OF DIET °Dietary needs will differ between patients. Parameters must be individualized. °Protein °· Guidelines: 1.2 to 1.3 gm/kg/day OR 1.5 gm/kg/day if patient is malnourished, catabolic, or has a protracted episode of peritonitis. A minimum of 50% of the protein intake should be of high biological value.  °· Goals: Meet protein requirements and replace dialysate losses while avoiding excessive accumulation of waste products. Achieve serum albumin greater than 3.5 g/dL.  °· Evaluate: Current nutritional status, serum albumin and BUN levels, presence of peritonitis.  °Sodium °· Guidelines: Usually 90 to 175 mEq (2000 to 4000 mg), but should be individualized.  °· Goals: Minimize complications of fluid imbalance.  °· Evaluate: Weight, blood pressure regulation, and presence of swelling (edema).  °Potassium °· Guidelines: Individualized; often not restricted, and may need to be supplemented.  °· Goals: Serum K+ levels between 4.0 to 5.0 mEq/L.  °· Evaluate: Serum K+ levels, usual intake of K+, appetite.  °Phosphorus °· Guidelines: 800 to 1200 mg/day (the high protein intake results in a high obligatory P intake).  °· Goal: Serum P levels between 4.5 to 6.0 mg/dL.  °· Evaluate: Serum P levels, usual P intake, P-binding medications: type, number, dosage, distribution.  °Fluids °· Guidelines: Individualized - may not be restricted for all patients.  °· Goal: Minimize complications of fluid  imbalance.  °· Evaluate: Weight, blood pressure regulation, sodium intake, and presence of edema.  °Document Released: 03/16/2005 Document Revised: 03/05/2011 Document Reviewed: 06/08/2006 °ExitCare® Patient Information ©2012 [employer]. ° ° °What to eat: ° °For your first meals, you should eat lightly; only small meals initially.  If you do not have nausea, you may eat larger meals.  Avoid spicy, greasy and heavy food.   ° °General Anesthesia, Adult, Care After  °Refer to this sheet in the next few weeks. These instructions provide you with information on caring for yourself after your procedure. Your health care provider may also give you more specific instructions. Your treatment has been planned according to current medical practices, but problems sometimes occur. Call   your health care provider if you have any problems or questions after your procedure.  °WHAT TO EXPECT AFTER THE PROCEDURE  °After the procedure, it is typical to experience:  °Sleepiness.  °Nausea and vomiting. °HOME CARE INSTRUCTIONS  °For the first 24 hours after general anesthesia:  °Have a responsible person with you.  °Do not drive a car. If you are alone, do not take public transportation.  °Do not drink alcohol.  °Do not take medicine that has not been prescribed by your health care provider.  °Do not sign important papers or make important decisions.  °You may resume a normal diet and activities as directed by your health care provider.  °Change bandages (dressings) as directed.  °If you have questions or problems that seem related to general anesthesia, call the hospital and ask for the anesthetist or anesthesiologist on call. °SEEK MEDICAL CARE IF:  °You have nausea and vomiting that continue the day after anesthesia.  °You develop a rash. °SEEK IMMEDIATE MEDICAL CARE IF:  °You have difficulty breathing.  °You have chest pain.  °You have any allergic problems. °Document Released: 06/22/2000 Document Revised: 11/16/2012 Document  Reviewed: 09/29/2012  °ExitCare® Patient Information ©2014 [employer].  ° ° °

## 2014-01-08 NOTE — Anesthesia Procedure Notes (Signed)
Procedure Name: Intubation Date/Time: 01/08/2014 7:48 AM Performed by: Charm BargesBUTLER, Tyler Moore-anesthesia Checklist: Patient identified, Emergency Drugs available, Suction available, Patient being monitored and Timeout performed Patient Re-evaluated:Patient Re-evaluated prior to inductionOxygen Delivery Method: Circle system utilized Preoxygenation: Moore-oxygenation with 100% oxygen Intubation Type: IV induction Laryngoscope Size: Mac and 3 Grade View: Grade I Tube type: Oral Tube size: 7.5 mm Number of attempts: 1 Airway Equipment and Method: Stylet Placement Confirmation: ETT inserted through vocal cords under direct vision,  positive ETCO2 and breath sounds checked- equal and bilateral Secured at: 22 cm Tube secured with: Tape Dental Injury: Teeth and Oropharynx as per Moore-operative assessment  Comments: Modified RSI with succinylcholine without cricoid pressure.

## 2014-01-08 NOTE — Interval H&P Note (Signed)
History and Physical Interval Note:  01/08/2014 7:17 AM  Tyler RamsJoseph A Roulhac  has presented today for surgery, with the diagnosis of EVALUATE CAPD CATH, CHRONIC RENAL FAILURE SYNDROME  The various methods of treatment have been discussed with the patient and family. After consideration of risks, benefits and other options for treatment, the patient has consented to  Procedure(s): DIAGNOSTIC LAPAROSCOPY, POSSIBLE LAPAROSCOPIC REPLACEMENT CONTINUOUS AMBULATORY PERITONEAL DIALYSIS  (CAPD) CATHETER (N/A) as a surgical intervention .  The patient's history has been reviewed, patient examined, no change in status, stable for surgery.  I have reviewed the patient's chart and labs.  Questions were answered to the patient's satisfaction.     Marigene Ehlersamirez Jr., Jed LimerickArmando

## 2014-01-08 NOTE — H&P (View-Only) (Signed)
History of Present Illness Tyler Moore(Brendan Gadson MD; 12/27/2013 10:54 AM) Patient words: eval pd catheter.  The patient is a 43 year old male who presents with a complaint of non functioning capd catheter. The patient is a 43 year old male referred by Dr. [place]acy [place] for an evaluation of a nonfunctioning CAPD catheter. This states it is placed in 2011 has not had any problems since then. He states that he feels well however does not draining appropriately.  The patient does have a right AV fistula and he will be undergoing hemodialysis intermittently until the CAPD catheter this functioning appropriately. The patient's had no issues with peritonitis.   Other Problems Lamar Laundry(Sonya Bynum, CMA; 12/27/2013 10:42 AM) Chronic Renal Failure Syndrome High blood pressure  Past Surgical History Gilmer Mor(Sonya Bynum, CMA; 12/27/2013 10:42 AM) Dialysis Shunt / Fistula  Diagnostic Studies History Gilmer Mor(Sonya Bynum, CMA; 12/27/2013 10:42 AM) Colonoscopy never  Allergies (Sonya Bynum, CMA; 12/27/2013 10:29 AM) No Known Drug Allergies09/30/2015  Medication History Lamar Laundry(Sonya Bynum, CMA; 12/27/2013 10:31 AM) Losartan Potassium (50MG  Tablet, Oral daily) Active. Metoprolol Tartrate (25MG  Tablet, Oral daily) Active. Omeprazole (20MG  Capsule DR, Oral daily) Active. AmLODIPine Besylate (10MG  Tablet, Oral daily) Active. Aspirin EC (81MG  Tablet DR, Oral daily) Active.  Social History Gilmer Mor(Sonya Bynum, CMA; 12/27/2013 10:42 AM) Alcohol use Occasional alcohol use. Caffeine use Carbonated beverages, Coffee, Tea. No drug use Tobacco use Never smoker.  Family History Gilmer Mor(Sonya Bynum, CMA; 12/27/2013 10:42 AM) Breast Cancer Mother. Hypertension Sister.  Review of Systems Lamar Laundry(Sonya Bynum CMA; 12/27/2013 10:42 AM) General Present- Fatigue. Not Present- Appetite Loss, Chills, Fever, Night Sweats, Weight Gain and Weight Loss. Skin Not Present- Change in Wart/Mole, Dryness, Hives, Jaundice, New Lesions, Non-Healing Wounds, Rash and  Ulcer. HEENT Not Present- Earache, Hearing Loss, Hoarseness, Nose Bleed, Oral Ulcers, Ringing in the Ears, Seasonal Allergies, Sinus Pain, Sore Throat, Visual Disturbances, Wears glasses/contact lenses and Yellow Eyes. Cardiovascular Present- Rapid Heart Rate, Shortness of Breath and Swelling of Extremities. Not Present- Chest Pain, Difficulty Breathing Lying Down, Leg Cramps and Palpitations. Gastrointestinal Present- Abdominal Pain and Bloating. Not Present- Bloody Stool, Change in Bowel Habits, Chronic diarrhea, Constipation, Difficulty Swallowing, Excessive gas, Gets full quickly at meals, Hemorrhoids, Indigestion, Nausea, Rectal Pain and Vomiting.   Vitals (Sonya Bynum CMA; 12/27/2013 10:43 AM) 12/27/2013 10:42 AM Weight: 309 lb Height: 68in Body Surface Area: 2.59 m Body Mass Index: 46.98 kg/m Temp.: 55F(Temporal)  Pulse: 75 (Regular)  BP: 180/90 (Sitting, Left Arm, Standard)    Physical Exam Tyler Moore(Seann Genther MD; 12/27/2013 10:55 AM) General Mental Status-Alert. General Appearance-Consistent with stated age. Hydration-Well hydrated. Voice-Normal.  Head and Neck Head-normocephalic, atraumatic with no lesions or palpable masses. Trachea-midline.  Eye Eyeball - Bilateral-Extraocular movements intact. Sclera/Conjunctiva - Bilateral-No scleral icterus.  Chest and Lung Exam Chest and lung exam reveals -quiet, even and easy respiratory effort with no use of accessory muscles and on auscultation, normal breath sounds, no adventitious sounds and normal vocal resonance. Inspection Chest Wall - Normal. Back - normal.  Cardiovascular Cardiovascular examination reveals -normal heart sounds, regular rate and rhythm with no murmurs and normal pedal pulses bilaterally.  Abdomen Inspection Inspection of the abdomen reveals - No Hernias. Skin - Scar - no surgical scars. Incisional scars - Note: Right lower quadrant CAPD  catheter. Palpation/Percussion Palpation and Percussion of the abdomen reveal - Soft, Non Tender, No Rebound tenderness, No Rigidity (guarding) and No hepatosplenomegaly. Auscultation Auscultation of the abdomen reveals - Bowel sounds normal.  Neurologic Neurologic evaluation reveals -alert and oriented x 3 with no impairment of  recent or remote memory. Mental Status-Normal.  Musculoskeletal Normal Exam - Left-Upper Extremity Strength Normal and Lower Extremity Strength Normal. Normal Exam - Right-Upper Extremity Strength Normal and Lower Extremity Strength Normal.    Assessment & Plan (Wendel Homeyer MD; 12/27/2013 10:59 AM) ENCOUNTER FOR CAPD (CONTINUOUS AMBULATORY PERITONEAL DIALYSIS) (V56.8  Z99.2) Impression: Patient is a 43-year-old male with a 40 history of a CAPD catheter. Patient states that it's been not exactly inappropriately.  We will proceed to the operative for diagnostic laparoscopy, troubleshooting of the CAPD catheter. Also discussed with him the potential need to be replaced. He agrees with proceeding at this time. Current Plans  Follow up as needed Schedule for Surgery I discussed with the patient the risks and benefits of the procedure to include but not limited to: Infection, bleeding,damage to structures, possible need for placement of CAPD catheter, possible continued dysfunction of CAPD catheter. The patient voiced understanding and wishes to proceed. 

## 2014-01-08 NOTE — Anesthesia Postprocedure Evaluation (Signed)
  Anesthesia Post-op Note  Patient: Tyler RamsJoseph A Blackwell  Procedure(s) Performed: Procedure(s): DIAGNOSTIC LAPAROSCOPY WITH LYSIS OF ADHESION AND REPOSITION OF CONTINUOUS AMBULATORY PERITONEAL DIALYSIS  (CAPD) CATHETER (N/A)  Patient Location: PACU  Anesthesia Type:General  Level of Consciousness: awake, alert  and oriented  Airway and Oxygen Therapy: Patient Spontanous Breathing  Post-op Pain: none  Post-op Assessment: Post-op Vital signs reviewed  Post-op Vital Signs: Reviewed  Last Vitals:  Filed Vitals:   01/08/14 1105  BP: 123/64  Pulse: 102  Temp:   Resp: 15    Complications: No apparent anesthesia complications

## 2014-01-08 NOTE — Transfer of Care (Signed)
Immediate Anesthesia Transfer of Care Note  Patient: Tyler Moore  Procedure(s) Performed: Procedure(s): DIAGNOSTIC LAPAROSCOPY WITH LYSIS OF ADHESION AND REPOSITION OF CONTINUOUS AMBULATORY PERITONEAL DIALYSIS  (CAPD) CATHETER (N/A)  Patient Location: PACU  Anesthesia Type:General  Level of Consciousness: sedated  Airway & Oxygen Therapy: Patient Spontanous Breathing and Patient connected to nasal cannula oxygen  Post-op Assessment: Report given to PACU RN, Post -op Vital signs reviewed and stable and Patient moving all extremities  Post vital signs: Reviewed and stable  Complications: No apparent anesthesia complications

## 2014-01-10 ENCOUNTER — Encounter (HOSPITAL_COMMUNITY): Payer: Self-pay | Admitting: General Surgery

## 2014-01-17 ENCOUNTER — Encounter (HOSPITAL_COMMUNITY): Payer: Self-pay | Admitting: Emergency Medicine

## 2014-01-17 ENCOUNTER — Emergency Department (HOSPITAL_COMMUNITY)
Admission: EM | Admit: 2014-01-17 | Discharge: 2014-01-17 | Disposition: A | Discharge status: Home / Self Care | Payer: Medicare Other | Attending: Emergency Medicine | Admitting: Emergency Medicine

## 2014-01-17 DIAGNOSIS — N186 End stage renal disease: Secondary | ICD-10-CM | POA: Insufficient documentation

## 2014-01-17 DIAGNOSIS — I120 Hypertensive chronic kidney disease with stage 5 chronic kidney disease or end stage renal disease: Secondary | ICD-10-CM | POA: Diagnosis not present

## 2014-01-17 DIAGNOSIS — M79601 Pain in right arm: Secondary | ICD-10-CM | POA: Diagnosis present

## 2014-01-17 DIAGNOSIS — Z8701 Personal history of pneumonia (recurrent): Secondary | ICD-10-CM | POA: Insufficient documentation

## 2014-01-17 DIAGNOSIS — Z79899 Other long term (current) drug therapy: Secondary | ICD-10-CM | POA: Insufficient documentation

## 2014-01-17 DIAGNOSIS — R600 Localized edema: Secondary | ICD-10-CM

## 2014-01-17 DIAGNOSIS — M25511 Pain in right shoulder: Secondary | ICD-10-CM | POA: Insufficient documentation

## 2014-01-17 DIAGNOSIS — R2231 Localized swelling, mass and lump, right upper limb: Secondary | ICD-10-CM | POA: Diagnosis not present

## 2014-01-17 DIAGNOSIS — Z7982 Long term (current) use of aspirin: Secondary | ICD-10-CM | POA: Diagnosis not present

## 2014-01-17 DIAGNOSIS — Z992 Dependence on renal dialysis: Secondary | ICD-10-CM | POA: Diagnosis not present

## 2014-01-17 LAB — BASIC METABOLIC PANEL
Anion gap: 18 — ABNORMAL HIGH (ref 5–15)
BUN: 69 mg/dL — ABNORMAL HIGH (ref 6–23)
CALCIUM: 9.2 mg/dL (ref 8.4–10.5)
CO2: 25 meq/L (ref 19–32)
Chloride: 97 mEq/L (ref 96–112)
Creatinine, Ser: 20.11 mg/dL — ABNORMAL HIGH (ref 0.50–1.35)
GFR calc Af Amer: 3 mL/min — ABNORMAL LOW (ref >90)
GFR calc non Af Amer: 2 mL/min — ABNORMAL LOW (ref >90)
GLUCOSE: 87 mg/dL (ref 70–99)
POTASSIUM: 5.4 meq/L — AB (ref 3.7–5.3)
Sodium: 140 mEq/L (ref 137–147)

## 2014-01-17 LAB — CBC WITH DIFFERENTIAL/PLATELET
BASOS PCT: 0 % (ref 0–1)
Basophils Absolute: 0 10*3/uL (ref 0.0–0.1)
EOS ABS: 0.8 10*3/uL — AB (ref 0.0–0.7)
Eosinophils Relative: 8 % — ABNORMAL HIGH (ref 0–5)
HEMATOCRIT: 27.2 % — AB (ref 39.0–52.0)
Hemoglobin: 8.4 g/dL — ABNORMAL LOW (ref 13.0–17.0)
LYMPHS ABS: 1.8 10*3/uL (ref 0.7–4.0)
LYMPHS PCT: 17 % (ref 12–46)
MCH: 25.6 pg — ABNORMAL LOW (ref 26.0–34.0)
MCHC: 30.9 g/dL (ref 30.0–36.0)
MCV: 82.9 fL (ref 78.0–100.0)
Monocytes Absolute: 0.8 10*3/uL (ref 0.1–1.0)
Monocytes Relative: 8 % (ref 3–12)
NEUTROS ABS: 6.9 10*3/uL (ref 1.7–7.7)
Neutrophils Relative %: 67 % (ref 43–77)
Platelets: 285 10*3/uL (ref 150–400)
RBC: 3.28 MIL/uL — ABNORMAL LOW (ref 4.22–5.81)
RDW: 19.3 % — ABNORMAL HIGH (ref 11.5–15.5)
WBC: 10.3 10*3/uL (ref 4.0–10.5)

## 2014-01-17 MED ORDER — OXYCODONE HCL 5 MG PO TABS
5.0000 mg | ORAL_TABLET | Freq: Once | ORAL | Status: AC
  Administered 2014-01-17: 5 mg via ORAL
  Filled 2014-01-17: qty 1

## 2014-01-17 MED ORDER — OXYCODONE-ACETAMINOPHEN 5-325 MG PO TABS: 2.0000 | ORAL_TABLET | Freq: Four times a day (QID) | ORAL | Status: DC | PRN

## 2014-01-17 NOTE — Discharge Instructions (Signed)

## 2014-01-17 NOTE — ED Notes (Signed)
Returned from Doppler

## 2014-01-17 NOTE — Progress Notes (Signed)
*  Preliminary Results* Right upper extremity venous duplex completed. Patient has right brachiocephalic fistula. Right upper extremity is negative for obvious deep and superficial vein thrombosis. The right cephalic vein in the proximal upper arm exhibits internal echoes which pulsates with the vein. The vein is fully compressible throughout its length, and is widely patent by color doppler. In the mid to distal upper arm, the cephalic vein exhibits fluid surrounding the vein and heterogeneity of the surrounding tissue. Etiology of these findings is unknown.  01/17/2014 2:21 PM  Gertie FeyMichelle Schylar Allard, RVT, RDCS, RDMS

## 2014-01-17 NOTE — ED Provider Notes (Signed)
Medical screening examination/treatment/procedure(s) were conducted as a shared visit with non-physician practitioner(s) and myself.  I personally evaluated the patient during the encounter.   EKG Interpretation   Date/Time:  Wednesday January 17 2014 15:25:48 EDT Ventricular Rate:  106 PR Interval:  159 QRS Duration: 78 QT Interval:  355 QTC Calculation: 471 R Axis:   59 Text Interpretation:  Sinus tachycardia Probable anteroseptal infarct, old  No significant change since last tracing Confirmed by Bebe ShaggyWICKLINE  MD, Erendida Wrenn  [phone]([id number]) on 01/17/2014 3:33:50 PM        Tyler Gaskinsonald W Thamara Leger, MD 01/17/14 1610

## 2014-01-17 NOTE — ED Provider Notes (Signed)
Patient seen/examined in the Emergency Department [facility] Patient reports right arm pain Exam : awake/alert, thrill noted to right UE fistula but he does have edema to right UE (reports chronic) Plan: labs/US imaging pending.   Joya Gaskinsonald W Kennethia Lynes, MD 01/17/14 [phone]

## 2014-01-17 NOTE — ED Provider Notes (Signed)
CSN: [id number]     Arrival date & time 01/17/14  1135 History   First MD Initiated Contact with Patient 01/17/14 1303     Chief Complaint  Patient presents with  . Arm Pain     (Consider location/radiation/quality/duration/timing/severity/associated sxs/prior Treatment) HPI Comments: Patient with hypertension, CKD, on dialysis, presents to the emergency department with chief complaint of right upper arm and shoulder pain. Patient reports that he has a AV fistula in the right upper extremity. He states that he has had increased pain in his right upper arm since yesterday. He states that the pain is worsened with movement and palpation. He denies any known mechanism of injury. He states the last time he was at dialysis, they had difficulty gaining access to his fistula. He denies fevers, chills, nausea, or vomiting. He has tried taking oxycodone for the pain with some relief.  The history is provided by the patient. No language interpreter was used.    Past Medical History  Diagnosis Date  . HTN (hypertension)   . Sleep apnea   . Pneumonia   . Chronic kidney disease     End stage   Past Surgical History  Procedure Laterality Date  . Other surgical history      catheter placement in stomach  . Peritoneal catheter insertion  2011  . Av fistula placement    . Capd insertion N/A 01/08/2014    Procedure: DIAGNOSTIC LAPAROSCOPY WITH LYSIS OF ADHESION AND REPOSITION OF CONTINUOUS AMBULATORY PERITONEAL DIALYSIS  (CAPD) CATHETER;  Surgeon: Axel FillerArmando Ramirez, MD;  Location: [facility] OR;  Service: General;  Laterality: N/A;   Family History  Problem Relation Age of Onset  . Cancer Mother     breast  . Hypertension Sister    History  Substance Use Topics  . Smoking status: Never Smoker   . Smokeless tobacco: Never Used  . Alcohol Use: Yes     Comment: rare    Review of Systems  All other systems reviewed and are negative.     Allergies  Review of patient's allergies indicates no  known allergies.  Home Medications   Prior to Admission medications   Medication Sig Start Date End Date Taking? Authorizing Provider  amLODipine (NORVASC) 5 MG tablet Take 5 mg by mouth daily.   Yes Historical Provider, MD  aspirin EC 81 MG tablet Take 81 mg by mouth daily.   Yes Historical Provider, MD  b complex-vitamin c-folic acid (NEPHRO-VITE) 0.8 MG TABS Take 0.8 mg by mouth at bedtime.   Yes Historical Provider, MD  calcium acetate (PHOSLO) 667 MG capsule Take 2,668 mg by mouth 3 (three) times daily with meals.   Yes Historical Provider, MD  cinacalcet (SENSIPAR) 60 MG tablet Take 60 mg by mouth 2 (two) times daily.   Yes Historical Provider, MD  lanthanum (FOSRENOL) 1000 MG chewable tablet Chew 1,000 mg by mouth 3 (three) times daily with meals.    Yes Historical Provider, MD  lidocaine-prilocaine (EMLA) cream Apply 1 application topically 3 (three) times daily as needed (applied to dialysis for pain).   Yes Historical Provider, MD  losartan (COZAAR) 50 MG tablet Take 50 mg by mouth 2 (two) times daily.    Yes Historical Provider, MD  metoprolol tartrate (LOPRESSOR) 25 MG tablet Take 25 mg by mouth 2 (two) times daily.   Yes Historical Provider, MD  omeprazole (PRILOSEC) 20 MG capsule Take 20 mg by mouth daily.    Yes Historical Provider, MD  oxyCODONE-acetaminophen (ROXICET) 5-325 MG  per tablet Take 1-2 tablets by mouth every 4 (four) hours as needed. 01/08/14  Yes Axel Filler, MD   BP 135/79  Pulse 106  Temp(Src) 98.3 F (36.8 C)  Resp 12  SpO2 100% Physical Exam  Nursing note and vitals reviewed. Constitutional: He is oriented to person, place, and time. He appears well-developed and well-nourished.  HENT:  Head: Normocephalic and atraumatic.  Eyes: Conjunctivae and EOM are normal. Pupils are equal, round, and reactive to light. Right eye exhibits no discharge. Left eye exhibits no discharge. No scleral icterus.  Neck: Normal range of motion. Neck supple. No JVD present.   Cardiovascular: Normal rate, regular rhythm, normal heart sounds and intact distal pulses.  Exam reveals no gallop and no friction rub.   No murmur heard. Palpable thrill to right extremity fistula  Pulmonary/Chest: Effort normal and breath sounds normal. No respiratory distress. He has no wheezes. He has no rales. He exhibits no tenderness.  Abdominal: Soft. He exhibits no distension and no mass. There is no tenderness. There is no rebound and no guarding.  Musculoskeletal: Normal range of motion. He exhibits no edema and no tenderness.  Moderate swelling of right upper extremity, tenderness to palpation of the biceps tendon, no bony abnormality or deformity, range of motion and strength limited secondary to pain  Neurological: He is alert and oriented to person, place, and time.  Skin: Skin is warm and dry.  Minimal erythema proximal to fistula  Psychiatric: He has a normal mood and affect. His behavior is normal. Judgment and thought content normal.    ED Course  Procedures (including critical care time) Results for orders placed during the hospital encounter of 01/17/14  CBC WITH DIFFERENTIAL      Result Value Ref Range   WBC 10.3  4.0 - 10.5 K/uL   RBC 3.28 (*) 4.22 - 5.81 MIL/uL   Hemoglobin 8.4 (*) 13.0 - 17.0 g/dL   HCT 16.1 (*) 09.6 - 04.5 %   MCV 82.9  78.0 - 100.0 fL   MCH 25.6 (*) 26.0 - 34.0 pg   MCHC 30.9  30.0 - 36.0 g/dL   RDW 40.9 (*) 81.1 - 91.4 %   Platelets 285  150 - 400 K/uL   Neutrophils Relative % 67  43 - 77 %   Lymphocytes Relative 17  12 - 46 %   Monocytes Relative 8  3 - 12 %   Eosinophils Relative 8 (*) 0 - 5 %   Basophils Relative 0  0 - 1 %   Neutro Abs 6.9  1.7 - 7.7 K/uL   Lymphs Abs 1.8  0.7 - 4.0 K/uL   Monocytes Absolute 0.8  0.1 - 1.0 K/uL   Eosinophils Absolute 0.8 (*) 0.0 - 0.7 K/uL   Basophils Absolute 0.0  0.0 - 0.1 K/uL   RBC Morphology POLYCHROMASIA PRESENT     WBC Morphology ATYPICAL LYMPHOCYTES    BASIC METABOLIC PANEL      Result  Value Ref Range   Sodium 140  137 - 147 mEq/L   Potassium 5.4 (*) 3.7 - 5.3 mEq/L   Chloride 97  96 - 112 mEq/L   CO2 25  19 - 32 mEq/L   Glucose, Bld 87  70 - 99 mg/dL   BUN 69 (*) 6 - 23 mg/dL   Creatinine, Ser 78.29 (*) 0.50 - 1.35 mg/dL   Calcium 9.2  8.4 - 56.2 mg/dL   GFR calc non Af Amer 2 (*) >90 mL/min  GFR calc Af Amer 3 (*) >90 mL/min   Anion gap 18 (*) 5 - 15   Dg Chest 2 View  01/08/2014   CLINICAL DATA:  Preop cardiovascular examination for peritoneal dialysis catheter placement. No chest complaints. Initial encounter.  EXAM: CHEST  2 VIEW  COMPARISON:  10/04/2009.  FINDINGS: There is cardiomegaly which has progressed from 2011. Mild aortic tortuosity.  Diffuse interstitial prominence without overt edema. No effusion or pneumothorax. No consolidation.  No acute osseous findings.  IMPRESSION: Diffuse interstitial coarsening that is likely congestive.   Electronically Signed   By: Tiburcio PeaJonathan  Watts M.D.   On: 01/08/2014 06:34      EKG Interpretation   Date/Time:  Wednesday January 17 2014 15:25:48 EDT Ventricular Rate:  106 PR Interval:  159 QRS Duration: 78 QT Interval:  355 QTC Calculation: 471 R Axis:   59 Text Interpretation:  Sinus tachycardia Probable anteroseptal infarct, old  No significant change since last tracing Confirmed by Bebe ShaggyWICKLINE  MD, DONALD  [phone]) on 01/17/2014 3:33:50 PM      MDM   Final diagnoses:  Pain of right upper extremity    Patient with RUE pain.  Right AV fistula.  TTP.  No fevers.  Will get labs and DVT study.  Patient has palpable thrill, good sensation to distal extremity, right hand is warm, nothing  2:55 PM Patient seen by and discussed with Dr. Bebe ShaggyWickline.  DVT study is negative.  Small fluid collection around the cephalic vein, patient states that his line infiltrated at last appointment.  No sign of abscess or cellulitis.   3:26 PM Dr. Bebe ShaggyWickline discussed the patient with nephrology.  Plan for dialysis tomorrow.  No emergent  dialysis.  Will treat pain and give shoulder sling.  Recommend ortho follow-up.  Patient understands and agrees with the plan.  He is stable and ready for discharge.  Editor: Gwendolyn FillMichelle A Simonetti (Cardiovascular Sonographer)      *Preliminary Results*  Right upper extremity venous duplex completed.  Patient has right brachiocephalic fistula.  Right upper extremity is negative for obvious deep and superficial vein thrombosis.  The right cephalic vein in the proximal upper arm exhibits internal echoes which pulsates with the vein. The vein is fully compressible throughout its length, and is widely patent by color doppler. In the mid to distal upper arm, the cephalic vein exhibits fluid surrounding the vein and heterogeneity of the surrounding tissue. Etiology of these findings is unknown.  01/17/2014 2:21 PM  Gertie FeyMichelle Simonetti, RVT, RDCS, RDMS     Roxy Horsemanobert Hettie Roselli, PA-C 01/17/14 [phone]

## 2014-01-17 NOTE — ED Notes (Signed)
Patient transported to X-ray 

## 2014-01-17 NOTE — ED Notes (Signed)
Per pt sts having right arm pain and hurts to move. sts he has a fistual in that arm. sts was here one week ago due to issue with fistula. Pt had dialysis on Saturday.

## 2014-01-29 ENCOUNTER — Ambulatory Visit (INDEPENDENT_AMBULATORY_CARE_PROVIDER_SITE_OTHER): Payer: Self-pay | Admitting: General Surgery

## 2014-01-29 NOTE — H&P (Signed)
  History of Present Illness Axel Filler(Kj Imbert MD; 01/29/2014 10:42 AM) Patient words: Discuss CAPD Catheter.  The patient is a 43 year old male who presents with a complaint of capd catheter malfunction. Patient is a 43 year old male who had previously seen secondary to malfunctioning CAPD catheter. Patient return secondary to a continuing malfunctioning CAPD catheter.  The patient states he is currently on hemodialysis undergoes dialysis Tuesday Thursday and Saturday.  The patient will like to proceed with excision of recurrent catheter replacement.   Medication History Maryan Puls(Christy Moore, [place]; 01/29/2014 10:22 AM) Oxycodone-Acetaminophen (5-325MG  Tablet, Oral) Active. Calcium Acetate (667MG  Capsule, Oral) Active. Sensipar (30MG  Tablet, Oral) Active.  Vitals Maryan Puls(Christy Moore MA; 01/29/2014 10:22 AM) 01/29/2014 10:21 AM Weight: 290.6 lb Height: 68in Body Surface Area: 2.51 m Body Mass Index: 44.19 kg/m Temp.: 69F(Temporal)  Pulse: 122 (Regular)  Resp.: 18 (Unlabored)  BP: 158/90 (Sitting, Left Arm, Standard)    Physical Exam Axel Filler(Irish Piech MD; 01/29/2014 10:42 AM) General Mental Status-Alert. General Appearance-Consistent with stated age. Hydration-Well hydrated. Voice-Normal.  Head and Neck Head-normocephalic, atraumatic with no lesions or palpable masses. Trachea-midline.  Chest and Lung Exam Chest and lung exam reveals -quiet, even and easy respiratory effort with no use of accessory muscles and on auscultation, normal breath sounds, no adventitious sounds and normal vocal resonance. Inspection Chest Wall - Normal. Back - normal.  Cardiovascular Cardiovascular examination reveals -normal heart sounds, regular rate and rhythm with no murmurs and normal pedal pulses bilaterally.  Abdomen Inspection Inspection of the abdomen reveals - No Hernias. Skin - Scar - no surgical scars. Incisional scars - Note: Right lower quadrant CAPD  catheter. Palpation/Percussion Palpation and Percussion of the abdomen reveal - Soft, Non Tender, No Rebound tenderness, No Rigidity (guarding) and No hepatosplenomegaly. Auscultation Auscultation of the abdomen reveals - Bowel sounds normal.  Neurologic Neurologic evaluation reveals -alert and oriented x 3 with no impairment of recent or remote memory. Mental Status-Normal.  Musculoskeletal Normal Exam - Left-Upper Extremity Strength Normal and Lower Extremity Strength Normal. Normal Exam - Right-Upper Extremity Strength Normal and Lower Extremity Strength Normal.    Assessment & Plan Axel Filler(Qianna Clagett MD; 01/29/2014 10:44 AM) Neta MendsENCOUNTER FOR CAPD (CONTINUOUS AMBULATORY PERITONEAL DIALYSIS) (V56.8  Z99.2) Impression: Patient is a 43 year old male with a 40 history of a CAPD catheter.  The patient will like to continue with replacement of a CAPD catheter, and lysis of adhesions.  I discussed with the patient the risks and benefits of the procedure to include but not limited to: Infection, bleeding, damage to structures, possible need for further surgery, continued malfunctioning CAPD catheter in need for hemodialysis. The patient was understanding and wishes to proceed.

## 2014-02-12 NOTE — Pre-Procedure Instructions (Signed)
Tyler RamsJoseph A Moore  02/12/2014   Your procedure is scheduled on:  Wed, Nov 25 @ 8:30 AM  [facility]  at 6:30 AM.  Call this number if you have problems the morning of surgery: [phone]   Remember:   Do not eat food or drink liquids after midnight.   Take these medicines the morning of surgery with A SIP OF WATER: Amlodipine(Norvasc),Metoprolol(Lopressor),Omeprazole(Prilosec),and Pain Pill(if needed)               Stop taking your Aspirin a week before surgery. No Goody's,BC's,Aleve,Ibuprofen,Fish Oil,or any Herbal Medications   Do not wear jewelry  Do not wear lotions, powders, or colognes. You may wear deodorant.  Men may shave face and neck.  Do not bring valuables to the hospital.  [facility] is not responsible                  for any belongings or valuables.               Contacts, dentures or bridgework may not be worn into surgery.  Leave suitcase in the car. After surgery it may be brought to your room.  For patients admitted to the hospital, discharge time is determined by your                treatment team.               Patients discharged the day of surgery will not be allowed to drive  home.    Special Instructions:  [place] - Preparing for Surgery  Before surgery, you can play an important role.  Because skin is not sterile, your skin needs to be as free of germs as possible.  You can reduce the number of germs on you skin by washing with CHG (chlorahexidine gluconate) soap before surgery.  CHG is an antiseptic cleaner which kills germs and bonds with the skin to continue killing germs even after washing.  Please DO NOT use if you have an allergy to CHG or antibacterial soaps.  If your skin becomes reddened/irritated stop using the CHG and inform your nurse when you arrive at Short Stay.  Do not shave (including legs and underarms) for at least 48 hours prior to the first CHG shower.  You may shave your face.  Please follow these instructions  carefully:   1.  Shower with CHG Soap the night before surgery and the                                morning of Surgery.  2.  If you choose to wash your hair, wash your hair first as usual with your       normal shampoo.  3.  After you shampoo, rinse your hair and body thoroughly to remove the                      Shampoo.  4.  Use CHG as you would any other liquid soap.  You can apply chg directly       to the skin and wash gently with scrungie or a clean washcloth.  5.  Apply the CHG Soap to your body ONLY FROM THE NECK DOWN.        Do not use on open wounds or open sores.  Avoid contact with your eyes,       ears, mouth and  genitals (private parts).  Wash genitals (private parts)       with your normal soap.  6.  Wash thoroughly, paying special attention to the area where your surgery        will be performed.  7.  Thoroughly rinse your body with warm water from the neck down.  8.  DO NOT shower/wash with your normal soap after using and rinsing off       the CHG Soap.  9.  Pat yourself dry with a clean towel.            10.  Wear clean pajamas.            11.  Place clean sheets on your bed the night of your first shower and do not        sleep with pets.  Day of Surgery  Do not apply any lotions/deoderants the morning of surgery.  Please wear clean clothes to the hospital/surgery center.     Please read over the following fact sheets that you were given: Pain Booklet, Coughing and Deep Breathing and Surgical Site Infection Prevention

## 2014-02-13 ENCOUNTER — Inpatient Hospital Stay (HOSPITAL_COMMUNITY)
Admission: RE | Admit: 2014-02-13 | Discharge: 2014-02-13 | Disposition: A | Discharge status: Home / Self Care | Payer: Medicare Other | Source: Ambulatory Visit

## 2014-02-13 ENCOUNTER — Encounter (HOSPITAL_COMMUNITY): Payer: Self-pay | Admitting: *Deleted

## 2014-02-13 ENCOUNTER — Encounter (HOSPITAL_COMMUNITY): Payer: Self-pay

## 2014-02-13 HISTORY — DX: Gastro-esophageal reflux disease without esophagitis: K21.9

## 2014-02-13 NOTE — Progress Notes (Signed)
EKG in epic from 10-21-153  CXR in epic from 01-08-14  Sleep study in epic 2014

## 2014-02-20 MED ORDER — CHLORHEXIDINE GLUCONATE 4 % EX LIQD
1.0000 "application " | Freq: Once | CUTANEOUS | Status: DC
  Filled 2014-02-20: qty 15

## 2014-02-20 MED ORDER — DEXTROSE 5 % IV SOLN
3.0000 g | INTRAVENOUS | Status: AC
  Administered 2014-02-21: 3 g via INTRAVENOUS
  Filled 2014-02-20: qty 3000

## 2014-02-21 ENCOUNTER — Ambulatory Visit (HOSPITAL_COMMUNITY): Payer: Medicare Other | Admitting: Anesthesiology

## 2014-02-21 ENCOUNTER — Ambulatory Visit (HOSPITAL_COMMUNITY)
Admission: RE | Admit: 2014-02-21 | Discharge: 2014-02-21 | Disposition: A | Discharge status: Home / Self Care | Payer: Medicare Other | Source: Ambulatory Visit | Attending: General Surgery | Admitting: General Surgery

## 2014-02-21 ENCOUNTER — Encounter (HOSPITAL_COMMUNITY): Payer: Self-pay | Admitting: *Deleted

## 2014-02-21 ENCOUNTER — Encounter (HOSPITAL_COMMUNITY)
Admission: RE | Disposition: A | Discharge status: Home / Self Care | Payer: Self-pay | Source: Ambulatory Visit | Attending: General Surgery

## 2014-02-21 DIAGNOSIS — K219 Gastro-esophageal reflux disease without esophagitis: Secondary | ICD-10-CM | POA: Diagnosis not present

## 2014-02-21 DIAGNOSIS — N186 End stage renal disease: Secondary | ICD-10-CM | POA: Diagnosis not present

## 2014-02-21 DIAGNOSIS — G4733 Obstructive sleep apnea (adult) (pediatric): Secondary | ICD-10-CM | POA: Diagnosis not present

## 2014-02-21 DIAGNOSIS — Z6841 Body Mass Index (BMI) 40.0 and over, adult: Secondary | ICD-10-CM | POA: Diagnosis not present

## 2014-02-21 DIAGNOSIS — I120 Hypertensive chronic kidney disease with stage 5 chronic kidney disease or end stage renal disease: Secondary | ICD-10-CM | POA: Insufficient documentation

## 2014-02-21 DIAGNOSIS — Z7982 Long term (current) use of aspirin: Secondary | ICD-10-CM | POA: Diagnosis not present

## 2014-02-21 DIAGNOSIS — T85611A Breakdown (mechanical) of intraperitoneal dialysis catheter, initial encounter: Secondary | ICD-10-CM | POA: Insufficient documentation

## 2014-02-21 HISTORY — PX: CAPD INSERTION: SHX5233

## 2014-02-21 HISTORY — PX: LYSIS OF ADHESION: SHX5961

## 2014-02-21 LAB — POCT I-STAT 4, (NA,K, GLUC, HGB,HCT)
GLUCOSE: 94 mg/dL (ref 70–99)
HCT: 31 % — ABNORMAL LOW (ref 39.0–52.0)
Hemoglobin: 10.5 g/dL — ABNORMAL LOW (ref 13.0–17.0)
Potassium: 4.4 mEq/L (ref 3.7–5.3)
SODIUM: 140 meq/L (ref 137–147)

## 2014-02-21 SURGERY — LAPAROSCOPIC INSERTION CONTINUOUS AMBULATORY PERITONEAL DIALYSIS  (CAPD) CATHETER
Anesthesia: General

## 2014-02-21 MED ORDER — NEOSTIGMINE METHYLSULFATE 10 MG/10ML IV SOLN
INTRAVENOUS | Status: DC | PRN
  Administered 2014-02-21: 3 mg via INTRAVENOUS

## 2014-02-21 MED ORDER — PHENYLEPHRINE HCL 10 MG/ML IJ SOLN
INTRAMUSCULAR | Status: DC | PRN
  Administered 2014-02-21 (×2): 80 ug via INTRAVENOUS
  Administered 2014-02-21 (×2): 40 ug via INTRAVENOUS
  Administered 2014-02-21: 80 ug via INTRAVENOUS
  Administered 2014-02-21: 40 ug via INTRAVENOUS
  Administered 2014-02-21: 120 ug via INTRAVENOUS

## 2014-02-21 MED ORDER — FENTANYL CITRATE 0.05 MG/ML IJ SOLN
INTRAMUSCULAR | Status: AC
  Filled 2014-02-21: qty 5

## 2014-02-21 MED ORDER — HYDROMORPHONE HCL 1 MG/ML IJ SOLN: 0.2500 mg | INTRAMUSCULAR | Status: DC | PRN

## 2014-02-21 MED ORDER — 0.9 % SODIUM CHLORIDE (POUR BTL) OPTIME
TOPICAL | Status: DC | PRN
  Administered 2014-02-21: 1000 mL

## 2014-02-21 MED ORDER — OXYCODONE HCL 5 MG PO TABS
5.0000 mg | ORAL_TABLET | Freq: Once | ORAL | Status: AC | PRN
Start: 2014-02-21 — End: 2014-02-21
  Administered 2014-02-21: 5 mg via ORAL

## 2014-02-21 MED ORDER — OXYCODONE HCL 5 MG PO TABS
ORAL_TABLET | ORAL | Status: AC
  Filled 2014-02-21: qty 1

## 2014-02-21 MED ORDER — ACETAMINOPHEN 10 MG/ML IV SOLN
INTRAVENOUS | Status: DC | PRN
Start: 2014-02-21 — End: 2014-02-21
  Administered 2014-02-21: 1000 mg via INTRAVENOUS

## 2014-02-21 MED ORDER — SUCCINYLCHOLINE CHLORIDE 20 MG/ML IJ SOLN
INTRAMUSCULAR | Status: DC | PRN
  Administered 2014-02-21: 60 mg via INTRAVENOUS

## 2014-02-21 MED ORDER — BUPIVACAINE HCL 0.25 % IJ SOLN
INTRAMUSCULAR | Status: DC | PRN
  Administered 2014-02-21: 1 mL
  Administered 2014-02-21: 9 mL

## 2014-02-21 MED ORDER — FENTANYL CITRATE 0.05 MG/ML IJ SOLN
INTRAMUSCULAR | Status: DC | PRN
  Administered 2014-02-21: 100 ug via INTRAVENOUS
  Administered 2014-02-21: 50 ug via INTRAVENOUS

## 2014-02-21 MED ORDER — OXYCODONE-ACETAMINOPHEN 5-325 MG PO TABS
1.0000 | ORAL_TABLET | ORAL | Status: DC | PRN
Start: 2014-02-21 — End: 2014-06-06

## 2014-02-21 MED ORDER — PROPOFOL 10 MG/ML IV BOLUS
INTRAVENOUS | Status: AC
  Filled 2014-02-21: qty 20

## 2014-02-21 MED ORDER — BUPIVACAINE HCL (PF) 0.25 % IJ SOLN
INTRAMUSCULAR | Status: AC
  Filled 2014-02-21: qty 30

## 2014-02-21 MED ORDER — ROCURONIUM BROMIDE 100 MG/10ML IV SOLN
INTRAVENOUS | Status: DC | PRN
  Administered 2014-02-21 (×2): 25 mg via INTRAVENOUS

## 2014-02-21 MED ORDER — SODIUM CHLORIDE 0.9 % IV SOLN
INTRAVENOUS | Status: DC | PRN
  Administered 2014-02-21 (×2): via INTRAVENOUS

## 2014-02-21 MED ORDER — MIDAZOLAM HCL 2 MG/2ML IJ SOLN
INTRAMUSCULAR | Status: AC
  Filled 2014-02-21: qty 2

## 2014-02-21 MED ORDER — LACTATED RINGERS IV SOLN
INTRAVENOUS | Status: DC | PRN
  Administered 2014-02-21: 08:00:00 via INTRAVENOUS

## 2014-02-21 MED ORDER — ONDANSETRON HCL 4 MG/2ML IJ SOLN
INTRAMUSCULAR | Status: DC | PRN
  Administered 2014-02-21: 4 mg via INTRAVENOUS

## 2014-02-21 MED ORDER — LIDOCAINE HCL (CARDIAC) 20 MG/ML IV SOLN
INTRAVENOUS | Status: DC | PRN
  Administered 2014-02-21: 60 mg via INTRAVENOUS

## 2014-02-21 MED ORDER — SODIUM CHLORIDE 0.9 % IR SOLN
Status: DC | PRN
  Administered 2014-02-21: 1000 mL

## 2014-02-21 MED ORDER — OXYCODONE HCL 5 MG/5ML PO SOLN: 5.0000 mg | Freq: Once | ORAL | Status: AC | PRN

## 2014-02-21 MED ORDER — PROPOFOL 10 MG/ML IV BOLUS
INTRAVENOUS | Status: DC | PRN
  Administered 2014-02-21: 140 mg via INTRAVENOUS

## 2014-02-21 MED ORDER — EPHEDRINE SULFATE 50 MG/ML IJ SOLN
INTRAMUSCULAR | Status: DC | PRN
  Administered 2014-02-21: 5 mg via INTRAVENOUS
  Administered 2014-02-21: 10 mg via INTRAVENOUS

## 2014-02-21 MED ORDER — GLYCOPYRROLATE 0.2 MG/ML IJ SOLN
INTRAMUSCULAR | Status: DC | PRN
  Administered 2014-02-21: 0.4 mg via INTRAVENOUS

## 2014-02-21 SURGICAL SUPPLY — 52 items
ADAPTER TITANIUM MEDIONICS (MISCELLANEOUS) ×2 IMPLANT
BENZOIN TINCTURE PRP APPL 2/3 (GAUZE/BANDAGES/DRESSINGS) ×2 IMPLANT
BIOPATCH RED 1 DISK 7.0 (GAUZE/BANDAGES/DRESSINGS) ×2 IMPLANT
BLADE SURG ROTATE 9660 (MISCELLANEOUS) ×2 IMPLANT
CANISTER SUCTION 2500CC (MISCELLANEOUS) IMPLANT
CATH EXTENDED DIALYSIS (CATHETERS) ×2 IMPLANT
CHLORAPREP W/TINT 26ML (MISCELLANEOUS) ×2 IMPLANT
COVER SURGICAL LIGHT HANDLE (MISCELLANEOUS) ×2 IMPLANT
DERMABOND ADVANCED (GAUZE/BANDAGES/DRESSINGS) ×1
DERMABOND ADVANCED .7 DNX12 (GAUZE/BANDAGES/DRESSINGS) ×1 IMPLANT
DEVICE TROCAR PUNCTURE CLOSURE (ENDOMECHANICALS) ×2 IMPLANT
DISSECTOR BLUNT TIP ENDO 5MM (MISCELLANEOUS) IMPLANT
DRAPE LAPAROSCOPIC ABDOMINAL (DRAPES) ×2 IMPLANT
DRAPE UTILITY XL STRL (DRAPES) ×4 IMPLANT
DRSG TEGADERM 2-3/8X2-3/4 SM (GAUZE/BANDAGES/DRESSINGS) ×2 IMPLANT
ELECT REM PT RETURN 9FT ADLT (ELECTROSURGICAL) ×2
ELECTRODE REM PT RTRN 9FT ADLT (ELECTROSURGICAL) ×1 IMPLANT
GAUZE SPONGE 2X2 8PLY STRL LF (GAUZE/BANDAGES/DRESSINGS) ×1 IMPLANT
GLOVE BIO SURGEON STRL SZ7 (GLOVE) ×4 IMPLANT
GLOVE BIO SURGEON STRL SZ7.5 (GLOVE) ×2 IMPLANT
GLOVE BIOGEL PI IND STRL 6.5 (GLOVE) ×1 IMPLANT
GLOVE BIOGEL PI IND STRL 7.0 (GLOVE) ×2 IMPLANT
GLOVE BIOGEL PI INDICATOR 6.5 (GLOVE) ×1
GLOVE BIOGEL PI INDICATOR 7.0 (GLOVE) ×2
GLOVE SURG SS PI 7.0 STRL IVOR (GLOVE) ×2 IMPLANT
GOWN STRL REUS W/ TWL LRG LVL3 (GOWN DISPOSABLE) ×2 IMPLANT
GOWN STRL REUS W/ TWL XL LVL3 (GOWN DISPOSABLE) ×1 IMPLANT
GOWN STRL REUS W/TWL LRG LVL3 (GOWN DISPOSABLE) ×2
GOWN STRL REUS W/TWL XL LVL3 (GOWN DISPOSABLE) ×1
KIT BASIN OR (CUSTOM PROCEDURE TRAY) ×2 IMPLANT
KIT ROOM TURNOVER OR (KITS) ×2 IMPLANT
NEEDLE INSUFFLATION 14GA 120MM (NEEDLE) ×2 IMPLANT
NS IRRIG 1000ML POUR BTL (IV SOLUTION) ×2 IMPLANT
PAD ARMBOARD 7.5X6 YLW CONV (MISCELLANEOUS) ×4 IMPLANT
SCISSORS LAP 5X35 DISP (ENDOMECHANICALS) ×2 IMPLANT
SET EXT 12IN DIALYSIS STAY-SAF (MISCELLANEOUS) ×2 IMPLANT
SET IRRIG TUBING LAPAROSCOPIC (IRRIGATION / IRRIGATOR) IMPLANT
SLEEVE ENDOPATH XCEL 5M (ENDOMECHANICALS) ×2 IMPLANT
SPONGE GAUZE 2X2 STER 10/PKG (GAUZE/BANDAGES/DRESSINGS) ×1
STYLET FALLER (MISCELLANEOUS) IMPLANT
STYLET FALLER MEDIONICS (MISCELLANEOUS) IMPLANT
SUT MNCRL AB 4-0 PS2 18 (SUTURE) ×4 IMPLANT
SUT PROLENE 2 0 CT2 30 (SUTURE) IMPLANT
SUT PROLENE 2 0 KS (SUTURE) ×2 IMPLANT
SUT SILK 2 0 SH (SUTURE) ×2 IMPLANT
SUT SILK 3 0 SH 30 (SUTURE) IMPLANT
TOWEL OR 17X26 10 PK STRL BLUE (TOWEL DISPOSABLE) ×2 IMPLANT
TRAY LAPAROSCOPIC (CUSTOM PROCEDURE TRAY) ×2 IMPLANT
TROCAR 5MMX150MM (TROCAR) ×2 IMPLANT
TROCAR XCEL NON-BLD 5MMX100MML (ENDOMECHANICALS) ×2 IMPLANT
TUBING CYSTO DISP (UROLOGICAL SUPPLIES) ×2 IMPLANT
TUBING INSUFFLATION (TUBING) ×2 IMPLANT

## 2014-02-21 NOTE — Discharge Instructions (Signed)
Peritoneal Dialysis (CAPD) Catheter  Healthy kidneys remove excess water and waste products from the body in the form of urine. When the kidneys cannot do this, serious problems develop and a person can fall into kidney failure.   In most cases, the kidneys can heal and recover to a better place.  Sometimes the kidneys remain somewhat damaged and a kidney specialist (nephrologist) needs to help manage the disease with medications and adjustments in diet.    Sometimes the kidney failure has progressed too far.  The bodys waste and fluid build up in the blood. Hands and legs swell. You start to feel more weak or nauseated. Your blood pressure may rise, to seeing you at serious risk for heart attack and stroke.  If not treated, you will eventually die. Dialysis is a treatment that replaces the work that your kidneys would do if they were healthy to help keep you alive.  Dialysis can be done using a machine outside of the body (hemodialysis) connected to your blood; or, it can be done with a tube placed to enter your abdomen (peritoneal dialysis). The peritoneum is the inner lining of your abdominal cavity, covering the inner organs & abdominal wall.  This inner lining of peritoneum works like a filter.   Peritoneal dialysis involves placed several quarts of sterile fluid into the inner abdomen/peritoneal cavity.  The waste products and fluids can exchange across the peritoneal membrane.  The fluid is later removed.  This most poor every day.  Sometimes the catheter can be attached to a machine that runs at night while you sleep (continuous cycler-assisted dialysis = CCPD).  Sometimes the fluid can be infused in her abdomen and then later removed hours later (continuous ambulatory peritoneal dialysis = CAPD).  If your nephrologist feels like you are a good candidate, you will be sent to be evaluated by a surgeon to consider placement of a peritoneal dialysis catheter.  Your nephrologist should have shown you  educational videos .  You should discuss with dialysis nurses about what life with a peritoneal dialysis catheter is like.  Most patients who have not had many abdominal operations are reasonable candidates for placement of a peritoneal dialysis catheter   It is important to understand the risks and benefits of the procedure prior to undergoing surgery.    If your surgeon feels you are a reasonable candidate, you will have a peritoneal dialysis catheter placed.    This requires an operation under general anesthesia.  The surgeon places a soft plastic tube ([place] curl catheter) into your abdomen.  The deeper curled tip rests down in the pelvis.  The middle part is tunneled inside your abdominal wall where cuffs provide a water-tight seal.  This leaves an outer foot of plastic tubing resting outside your body, usually in your lower abdomen below the beltline near your waist.  This is a quick procedure that can often be done as an outpatient with the possibility of staying overnight.  It takes several weeks for the catheter to heal into a watertight seal.  Initially, your nephrologist we will have the dialysis nurses do gentle flushes through the catheter.  Once the catheter is well sealed in, you will begin larger exchanges of fluid to do full peritoneal dialysis.  Your nephrologist and dialysis nurses will help guide you through this.  While placement of the catheter is usually not technically difficult, there are risks to the procedure.  The biggest risk is infection.  This is why we place  the catheter in the operating room with the use of intravenous antibiotics.  Using sterile technique to hook up the catheter to your dialysis fluids is essential.  Most infections of the catheter are mild and can be treated with antibiotics placed in the vein were placed with the peritoneal fluid.  However, sometimes the infection worsens or persists and the catheter needs to be removed.  Occasionally, the catheters can  be blocked due to inner organs plugging up the tubing or kinks.  Sometimes the catheter leaks and needs to be replaced.  Sometimes the inner abdomen has too many adhesions from prior surgeries or infections and there is not enough space for fluid exchanges to occur.  Sometimes, peritoneal dialysis is not enough to compensate for the kidney failure.   In rare instances, the inner lining of the abdomen becomes severely thickened or inflamed and peritoneal dialysis can no longer work.  Sometimes it is not possible to safely replace a new catheter and the patient must go on hemodialysis permanently.  It is very common to need hemodialysis intermittently even if you have a peritoneal dialysis catheter in cases of emergencies or if peritoneal dialysis fails.  Your nephrologist and surgeon can help you decide what the best form of dialysis is for you   What to eat:  For your first meals, you should eat lightly; only small meals initially.  If you do not have nausea, you may eat larger meals.  Avoid spicy, greasy and heavy food.    General Anesthesia, Adult, Care After  Refer to this sheet in the next few weeks. These instructions provide you with information on caring for yourself after your procedure. Your health care provider may also give you more specific instructions. Your treatment has been planned according to current medical practices, but problems sometimes occur. Call your health care provider if you have any problems or questions after your procedure.  WHAT TO EXPECT AFTER THE PROCEDURE  After the procedure, it is typical to experience:  Sleepiness.  Nausea and vomiting. HOME CARE INSTRUCTIONS  For the first 24 hours after general anesthesia:  Have a responsible person with you.  Do not drive a car. If you are alone, do not take public transportation.  Do not drink alcohol.  Do not take medicine that has not been prescribed by your health care provider.  Do not sign important papers or make  important decisions.  You may resume a normal diet and activities as directed by your health care provider.  Change bandages (dressings) as directed.  If you have questions or problems that seem related to general anesthesia, call the hospital and ask for the anesthetist or anesthesiologist on call. SEEK MEDICAL CARE IF:  You have nausea and vomiting that continue the day after anesthesia.  You develop a rash. SEEK IMMEDIATE MEDICAL CARE IF:  You have difficulty breathing.  You have chest pain.  You have any allergic problems. Document Released: 06/22/2000 Document Revised: 11/16/2012 Document Reviewed: 09/29/2012  [facility] Patient Information 2014 [employer] [place].

## 2014-02-21 NOTE — Interval H&P Note (Signed)
History and Physical Interval Note:  02/21/2014 7:26 AM  Tyler RamsJoseph A Blayney  has presented today for surgery, with the diagnosis of malfunctioning CAPD cath  The various methods of treatment have been discussed with the patient and family. After consideration of risks, benefits and other options for treatment, the patient has consented to  Procedure(s): LAPAROSCOPIC REPLACEMENT CONTINUOUS AMBULATORY PERITONEAL DIALYSIS  (CAPD) CATHETER (N/A) LYSIS OF ADHESION (N/A) as a surgical intervention .  The patient's history has been reviewed, patient examined, no change in status, stable for surgery.  I have reviewed the patient's chart and labs.  Questions were answered to the patient's satisfaction.     Marigene Ehlersamirez Jr., Jed LimerickArmando

## 2014-02-21 NOTE — Transfer of Care (Signed)
Immediate Anesthesia Transfer of Care Note  Patient: Tyler Moore  Procedure(s) Performed: Procedure(s): LAPAROSCOPIC REPLACEMENT CONTINUOUS AMBULATORY PERITONEAL DIALYSIS  (CAPD) CATHETER (N/A) LYSIS OF ADHESION (N/A)  Patient Location: PACU  Anesthesia Type:General  Level of Consciousness: awake and alert   Airway & Oxygen Therapy: Patient Spontanous Breathing and Patient connected to face mask oxygen  Post-op Assessment: Report given to PACU RN and Post -op Vital signs reviewed and stable  Post vital signs: Reviewed and stable  Complications: No apparent anesthesia complications

## 2014-02-21 NOTE — Anesthesia Postprocedure Evaluation (Signed)
  Anesthesia Post-op Note  Patient: Tyler Moore  Procedure(s) Performed: Procedure(s): LAPAROSCOPIC REPLACEMENT CONTINUOUS AMBULATORY PERITONEAL DIALYSIS  (CAPD) CATHETER (N/A) LYSIS OF ADHESION (N/A)  Patient Location: PACU  Anesthesia Type:General  Level of Consciousness: awake and alert   Airway and Oxygen Therapy: Patient Spontanous Breathing  Post-op Pain: mild  Post-op Assessment: Post-op Vital signs reviewed, Patient's Cardiovascular Status Stable, Respiratory Function Stable, Patent Airway, No signs of Nausea or vomiting and Pain level controlled  Post-op Vital Signs: Reviewed and stable  Last Vitals:  Filed Vitals:   02/21/14 1116  BP: 171/97  Pulse:   Temp:   Resp: 15    Complications: No apparent anesthesia complications

## 2014-02-21 NOTE — Op Note (Signed)
02/21/2014  10:17 AM  PATIENT:  Tyler RamsJoseph A Montone  43 y.o. male  PRE-OPERATIVE DIAGNOSIS:  malfunctioning CAPD cath  POST-OPERATIVE DIAGNOSIS:  malfunctioning CAPD cath  PROCEDURE:  Procedure(s): LAPAROSCOPIC REPLACEMENT CONTINUOUS AMBULATORY PERITONEAL DIALYSIS  (CAPD) CATHETER (N/A) LYSIS OF ADHESIONS (N/A)  SURGEON:  Surgeon(s) and Role:    * Axel FillerArmando Deborh Pense, MD - Primary   ASSISTANTS: none   ANESTHESIA:   local and general  EBL:  Total I/O In: 600 [I.V.:600] Out: -   BLOOD ADMINISTERED:none  DRAINS: PD catheter   LOCAL MEDICATIONS USED:  BUPIVICAINE   SPECIMEN:  No Specimen  DISPOSITION OF SPECIMEN:  N/A  COUNTS:  YES  TOURNIQUET:  * No tourniquets in log *  DICTATION: .Dragon Dictation After the patient was consented he was taken back to the operating room and placed in the supine position with bilateral SCDs in place. After appropriate antibiotics were confirmed a timeout was called and all facts were verified.    A Veress needle technique was used to insufflate the abdomen. Subsequent to this a 5 mm trocar and camera were then placed intra-abdominally. There was no injury to any intra-abdominal organs. There is large amount of adhesions in the midline. The right upper quadrant and right lower quadrant. Adhesions. A 5mm trocar was placed in the right upper quadrant, and right lower quadrant. At this time proceeded to lyse adhesions in the midline. Adhesions consisted of omental and small bowel adhesions to the anterior abdominal wall.  This is approximately 1 hour of adhesiolysis.  At this time proceeded to place the CAPD catheter to the left side of the abdomen.  This was brought in through a 5 mm trocar at the left upper quadrant. This time this was tunneled superiorly using a vascular tunnel. The intra-abdominal felt cuff was brought up superficial to the peritoneum. A second stab incision was made superior to the exit site and the second extension was brought up  through this site. This was tunneled distally and inferiorly and brought there is a another stab incision. At this time we used normal saline demonstrate that the PD catheter was free-flowing and evacuated. This was done with instilling 500 mL of saline and then evacuated with gravity. The pigtail portion of the CAPD catheter currently as far inferior as possible in the pelvis. There were some sick mortal adhesions that were not taken down to this lay just superior to that.  This time proceeded to remove the right-sided old CAPD catheter. An incision was made just superior to the exit site. Blunt dissection was taken down the catheter. The catheter was removed. As was the anterior peritoneal portion of the catheter. This was removed in its entirety.  At this time all trochars were removed insufflation was evacuated all trocar sites were reapproximated using a 4-0 Vicryl subarticular fashion.  The skin was dressed with Dermabond. The patient was taken to the recovery room in stable condition.   PLAN Oa Veress needle technique was used to insufflate the abdomen to 15 mmHg in the left subcostal margin. Subsequent to this a 5 mm trocar and camera were then placed intra-abdominally. It was no injury to any intra-abdominal organs.F CARE: Discharge to home after PACU  PATIENT DISPOSITION:  PACU - hemodynamically stable.   Delay start of Pharmacological VTE agent (>24hrs) due to surgical blood loss or risk of bleeding: not applicable

## 2014-02-21 NOTE — Progress Notes (Signed)
Pt has pre existing AVF in RUE---positive bruit and thrill.

## 2014-02-21 NOTE — Anesthesia Preprocedure Evaluation (Signed)
Anesthesia Evaluation  Patient identified by MRN, date of birth, ID band Patient awake    Reviewed: Allergy & Precautions, H&P , NPO status , Patient's Chart, lab work & pertinent test results, reviewed documented beta blocker date and time   History of Anesthesia Complications Negative for: history of anesthetic complications  Airway Mallampati: III  TM Distance: >3 FB Neck ROM: Full    Dental  (+) Teeth Intact   Pulmonary neg shortness of breath, sleep apnea and Continuous Positive Airway Pressure Ventilation , neg COPD breath sounds clear to auscultation        Cardiovascular hypertension, Pt. on medications and Pt. on home beta blockers - angina- CAD, - CABG and - CHF Rhythm:Regular     Neuro/Psych negative neurological ROS  negative psych ROS   GI/Hepatic Neg liver ROS, GERD-  Medicated and Controlled,  Endo/Other  Morbid obesity  Renal/GU ESRF and DialysisRenal disease     Musculoskeletal   Abdominal   Peds  Hematology negative hematology ROS (+)   Anesthesia Other Findings   Reproductive/Obstetrics                             Anesthesia Physical Anesthesia Plan  ASA: III  Anesthesia Plan: General   Post-op Pain Management:    Induction: Intravenous  Airway Management Planned: Oral ETT  Additional Equipment: None  Intra-op Plan:   Post-operative Plan: Extubation in OR  Informed Consent: I have reviewed the patients History and Physical, chart, labs and discussed the procedure including the risks, benefits and alternatives for the proposed anesthesia with the patient or authorized representative who has indicated his/her understanding and acceptance.   Dental advisory given  Plan Discussed with: CRNA and Surgeon  Anesthesia Plan Comments:         Anesthesia Quick Evaluation

## 2014-02-21 NOTE — H&P (View-Only) (Signed)
  History of Present Illness (Omya Winfield MD; 01/29/2014 10:42 AM) Patient words: Discuss CAPD Catheter.  The patient is a 43 year old male who presents with a complaint of capd catheter malfunction. Patient is a 43-year-old male who had previously seen secondary to malfunctioning CAPD catheter. Patient return secondary to a continuing malfunctioning CAPD catheter.  The patient states he is currently on hemodialysis undergoes dialysis Tuesday Thursday and Saturday.  The patient will like to proceed with excision of recurrent catheter replacement.   Medication History (Christy Moore, MA; 01/29/2014 10:22 AM) Oxycodone-Acetaminophen (5-325MG Tablet, Oral) Active. Calcium Acetate (667MG Capsule, Oral) Active. Sensipar (30MG Tablet, Oral) Active.  Vitals (Christy Moore MA; 01/29/2014 10:22 AM) 01/29/2014 10:21 AM Weight: 290.6 lb Height: 68in Body Surface Area: 2.51 m Body Mass Index: 44.19 kg/m Temp.: 97F(Temporal)  Pulse: 122 (Regular)  Resp.: 18 (Unlabored)  BP: 158/90 (Sitting, Left Arm, Standard)    Physical Exam (Mayzee Reichenbach MD; 01/29/2014 10:42 AM) General Mental Status-Alert. General Appearance-Consistent with stated age. Hydration-Well hydrated. Voice-Normal.  Head and Neck Head-normocephalic, atraumatic with no lesions or palpable masses. Trachea-midline.  Chest and Lung Exam Chest and lung exam reveals -quiet, even and easy respiratory effort with no use of accessory muscles and on auscultation, normal breath sounds, no adventitious sounds and normal vocal resonance. Inspection Chest Wall - Normal. Back - normal.  Cardiovascular Cardiovascular examination reveals -normal heart sounds, regular rate and rhythm with no murmurs and normal pedal pulses bilaterally.  Abdomen Inspection Inspection of the abdomen reveals - No Hernias. Skin - Scar - no surgical scars. Incisional scars - Note: Right lower quadrant CAPD  catheter. Palpation/Percussion Palpation and Percussion of the abdomen reveal - Soft, Non Tender, No Rebound tenderness, No Rigidity (guarding) and No hepatosplenomegaly. Auscultation Auscultation of the abdomen reveals - Bowel sounds normal.  Neurologic Neurologic evaluation reveals -alert and oriented x 3 with no impairment of recent or remote memory. Mental Status-Normal.  Musculoskeletal Normal Exam - Left-Upper Extremity Strength Normal and Lower Extremity Strength Normal. Normal Exam - Right-Upper Extremity Strength Normal and Lower Extremity Strength Normal.    Assessment & Plan (Jujuan Dugo MD; 01/29/2014 10:44 AM) ENCOUNTER FOR CAPD (CONTINUOUS AMBULATORY PERITONEAL DIALYSIS) (V56.8  Z99.2) Impression: Patient is a 43-year-old male with a 40 history of a CAPD catheter.  The patient will like to continue with replacement of a CAPD catheter, and lysis of adhesions.  I discussed with the patient the risks and benefits of the procedure to include but not limited to: Infection, bleeding, damage to structures, possible need for further surgery, continued malfunctioning CAPD catheter in need for hemodialysis. The patient was understanding and wishes to proceed. 

## 2014-02-26 ENCOUNTER — Encounter (HOSPITAL_COMMUNITY): Payer: Self-pay | Admitting: General Surgery

## 2014-05-07 ENCOUNTER — Ambulatory Visit (INDEPENDENT_AMBULATORY_CARE_PROVIDER_SITE_OTHER): Payer: Self-pay | Admitting: General Surgery

## 2014-05-30 ENCOUNTER — Encounter (HOSPITAL_COMMUNITY): Payer: Self-pay

## 2014-05-30 ENCOUNTER — Encounter (HOSPITAL_COMMUNITY)
Admission: RE | Admit: 2014-05-30 | Discharge: 2014-05-30 | Disposition: A | Discharge status: Home / Self Care | Payer: Medicare Other | Source: Ambulatory Visit | Attending: General Surgery | Admitting: General Surgery

## 2014-05-30 DIAGNOSIS — K219 Gastro-esophageal reflux disease without esophagitis: Secondary | ICD-10-CM | POA: Diagnosis not present

## 2014-05-30 DIAGNOSIS — T85691A Other mechanical complication of intraperitoneal dialysis catheter, initial encounter: Secondary | ICD-10-CM | POA: Diagnosis not present

## 2014-05-30 DIAGNOSIS — Y929 Unspecified place or not applicable: Secondary | ICD-10-CM | POA: Diagnosis not present

## 2014-05-30 DIAGNOSIS — G4733 Obstructive sleep apnea (adult) (pediatric): Secondary | ICD-10-CM | POA: Diagnosis not present

## 2014-05-30 DIAGNOSIS — I120 Hypertensive chronic kidney disease with stage 5 chronic kidney disease or end stage renal disease: Secondary | ICD-10-CM | POA: Diagnosis not present

## 2014-05-30 DIAGNOSIS — N186 End stage renal disease: Secondary | ICD-10-CM | POA: Diagnosis not present

## 2014-05-30 DIAGNOSIS — Y841 Kidney dialysis as the cause of abnormal reaction of the patient, or of later complication, without mention of misadventure at the time of the procedure: Secondary | ICD-10-CM | POA: Diagnosis not present

## 2014-05-30 HISTORY — DX: Presence of spectacles and contact lenses: Z97.3

## 2014-05-30 HISTORY — DX: Disease of blood and blood-forming organs, unspecified: D75.9

## 2014-05-30 LAB — CBC
HCT: 40.3 % (ref 39.0–52.0)
Hemoglobin: 12.9 g/dL — ABNORMAL LOW (ref 13.0–17.0)
MCH: 26.3 pg (ref 26.0–34.0)
MCHC: 32 g/dL (ref 30.0–36.0)
MCV: 82.1 fL (ref 78.0–100.0)
PLATELETS: 271 10*3/uL (ref 150–400)
RBC: 4.91 MIL/uL (ref 4.22–5.81)
RDW: 18.1 % — AB (ref 11.5–15.5)
WBC: 8.1 10*3/uL (ref 4.0–10.5)

## 2014-05-30 LAB — BASIC METABOLIC PANEL
Anion gap: 10 (ref 5–15)
BUN: 34 mg/dL — ABNORMAL HIGH (ref 6–23)
CALCIUM: 10.9 mg/dL — AB (ref 8.4–10.5)
CO2: 36 mmol/L — ABNORMAL HIGH (ref 19–32)
Chloride: 94 mmol/L — ABNORMAL LOW (ref 96–112)
Creatinine, Ser: 11.24 mg/dL — ABNORMAL HIGH (ref 0.50–1.35)
GFR calc non Af Amer: 5 mL/min — ABNORMAL LOW (ref >90)
GFR, EST AFRICAN AMERICAN: 6 mL/min — AB (ref >90)
GLUCOSE: 92 mg/dL (ref 70–99)
POTASSIUM: 4.7 mmol/L (ref 3.5–5.1)
SODIUM: 140 mmol/L (ref 135–145)

## 2014-05-30 NOTE — Progress Notes (Signed)
Pt denies SOB, chest pain, and being under the care of a cardiologist. Pt stated that [facility]; records requested. Pt BP elevated ( see documentation) , pt stated , I didn't take my blood pressure medicine today, I am going home to take it."  Pt made aware of the consequences of not taking BP medications as prescribed. Pt chart forwarded [place], [place] ( [place]) to review abnormal EKG and labs ( creatinine 11.24) .

## 2014-05-30 NOTE — Pre-Procedure Instructions (Signed)
Tyler RamsJoseph A Moore  05/30/2014   Your procedure is scheduled on: Wednesday, June 06, 2014  [facility]30 [facility].  Call this number if you have problems the morning of surgery: [phone]   Remember:   Do not eat food or drink liquids after midnight Tuesday, June 05, 2014   Take these medicines the morning of surgery with A SIP OF WATER: amLODipine (NORVASC), metoprolol tartrate (LOPRESSOR), omeprazole (PRILOSEC), if needed: oxyCODONE for pain  Stop taking Aspirin, vitamins, and herbal medications. Do not take any NSAIDs ie: Ibuprofen, Advil, Naproxen or any medication containing Aspirin;stop now.   Do not wear jewelry, make-up or nail polish.  Do not wear lotions, powders, or perfumes. You may not wear deodorant.  Do not shave 48 hours prior to surgery. Men may shave face and neck.  Do not bring valuables to the hospital.  [facility] any belongings or valuables.               Contacts, dentures or bridgework may not be worn into surgery.  Leave suitcase in the car. After surgery it may be brought to your room.  For patients admitted to the hospital, discharge time is determined by your treatment team.               Patients discharged the day of surgery will not be allowed to drive home.  Name and phone number of your driver:   Special Instructions:  Special Instructions:Special Instructions: [facility] Pre[facility]  Before surgery, you can play an important role.  Because skin is not sterile, your skin needs to be as free of germs as possible.  You can reduce the number of germs on you skin by washing with CHG (chlorahexidine gluconate) soap before surgery.  CHG is an antiseptic cleaner which kills germs and bonds with the skin to continue killing germs even after washing.  Please DO NOT use if you have an allergy to CHG or antibacterial soaps.  If your skin becomes reddened/irritated stop using the CHG and inform your  nurse when you arrive at Short Stay.  Do not shave (including legs and underarms) for at least 48 hours prior to the first CHG shower.  You may shave your face.  Please follow these instructions carefully:   1.  Shower with CHG Soap the night before surgery and the morning of Surgery.  2.  If you choose to wash your hair, wash your hair first as usual with your normal shampoo.  3.  After you shampoo, rinse your hair and body thoroughly to remove the Shampoo.  4.  Use CHG as you would any other liquid soap.  You can apply chg directly  to the skin and wash gently with scrungie or a clean washcloth.  5.  Apply the CHG Soap to your body ONLY FROM THE NECK DOWN.  Do not use on open wounds or open sores.  Avoid contact with your eyes, ears, mouth and genitals (private parts).  Wash genitals (private parts) with your normal soap.  6.  Wash thoroughly, paying special attention to the area where your surgery will be performed.  7.  Thoroughly rinse your body with warm water from the neck down.  8.  DO NOT shower/wash with your normal soap after using and rinsing off the CHG Soap.  9.  Pat yourself dry with a clean towel.  10.  Wear clean pajamas.            11.  Place clean sheets on your bed the night of your first shower and do not sleep with pets.  Day of Surgery  Do not apply any lotions/deodorants the morning of surgery.  Please wear clean clothes to the hospital/surgery center.   Please read over the following fact sheets that you were given: Pain Booklet, Coughing and Deep Breathing and Surgical Site Infection Prevention

## 2014-05-31 NOTE — Progress Notes (Signed)
Anesthesia Chart Review:  Pt is 44 year old male scheduled for CAPD catheter removal on 06/06/2014 with Dr. Derrell Lollingamirez.   PMH includes: HTN,  ESRD, OSA, GERD. S/p replacement of CAPD catheter, lysis of adhesions 02/21/2014, s/p diagnostic laparoscopy with lysis of adhesions and reposition of CAPD catheter 01/08/2014. Never smoker. BMI 41  Preoperative labs reviewed.  Cr 11.24.   EKG 01/17/2014: Sinus tachycardia (106 bpm). Probable anteroseptal infarct, old.   Echo 03/12/2010: -normal LV size, mild LV concentric hypertrophy -abnormal diastolic relaxation -normal LV systolic function -normal RV size and function -mildly dilated LA -aortic valve sclerosis -no significant valve stenosis -no significant valvular regurgitation -no pericardial effusion  Stress echo 12/142011: -negative dobutamine stress echocardiography with no echocardiographic evidence of inducible ischemia. Negative stress ECG.  -global resting LV function was normal and there were no segmental wall motion abnormalities. With stress, there were no segmental wall motion abnormalities with normal increase in global LV function.   If no changes, I anticipate pt can proceed with surgery as scheduled.   Rica Mastngela Kabbe, FNP-BC [facility]/Anesthesiology Phone: [phone] 05/31/2014 3:58 PM

## 2014-06-05 MED ORDER — DEXTROSE 5 % IV SOLN
3.0000 g | INTRAVENOUS | Status: AC
  Administered 2014-06-06: 3 g via INTRAVENOUS
  Filled 2014-06-05: qty 3000

## 2014-06-06 ENCOUNTER — Ambulatory Visit (HOSPITAL_COMMUNITY): Payer: Medicare Other | Admitting: Emergency Medicine

## 2014-06-06 ENCOUNTER — Ambulatory Visit (HOSPITAL_COMMUNITY): Payer: Medicare Other | Admitting: Anesthesiology

## 2014-06-06 ENCOUNTER — Ambulatory Visit (HOSPITAL_COMMUNITY)
Admission: RE | Admit: 2014-06-06 | Discharge: 2014-06-06 | Disposition: A | Discharge status: Home / Self Care | Payer: Medicare Other | Source: Ambulatory Visit | Attending: General Surgery | Admitting: General Surgery

## 2014-06-06 ENCOUNTER — Encounter (HOSPITAL_COMMUNITY): Payer: Self-pay | Admitting: *Deleted

## 2014-06-06 ENCOUNTER — Encounter (HOSPITAL_COMMUNITY)
Admission: RE | Disposition: A | Discharge status: Home / Self Care | Payer: Self-pay | Source: Ambulatory Visit | Attending: General Surgery

## 2014-06-06 DIAGNOSIS — N186 End stage renal disease: Secondary | ICD-10-CM | POA: Insufficient documentation

## 2014-06-06 DIAGNOSIS — K219 Gastro-esophageal reflux disease without esophagitis: Secondary | ICD-10-CM | POA: Insufficient documentation

## 2014-06-06 DIAGNOSIS — T85691A Other mechanical complication of intraperitoneal dialysis catheter, initial encounter: Secondary | ICD-10-CM | POA: Diagnosis not present

## 2014-06-06 DIAGNOSIS — G4733 Obstructive sleep apnea (adult) (pediatric): Secondary | ICD-10-CM | POA: Insufficient documentation

## 2014-06-06 DIAGNOSIS — I120 Hypertensive chronic kidney disease with stage 5 chronic kidney disease or end stage renal disease: Secondary | ICD-10-CM | POA: Diagnosis not present

## 2014-06-06 DIAGNOSIS — Y841 Kidney dialysis as the cause of abnormal reaction of the patient, or of later complication, without mention of misadventure at the time of the procedure: Secondary | ICD-10-CM | POA: Insufficient documentation

## 2014-06-06 DIAGNOSIS — Y929 Unspecified place or not applicable: Secondary | ICD-10-CM | POA: Insufficient documentation

## 2014-06-06 HISTORY — PX: CAPD REMOVAL: SHX5234

## 2014-06-06 LAB — POCT I-STAT 4, (NA,K, GLUC, HGB,HCT)
GLUCOSE: 91 mg/dL (ref 70–99)
HEMATOCRIT: 40 % (ref 39.0–52.0)
HEMOGLOBIN: 13.6 g/dL (ref 13.0–17.0)
POTASSIUM: 5.5 mmol/L — AB (ref 3.5–5.1)
Sodium: 137 mmol/L (ref 135–145)

## 2014-06-06 SURGERY — CONTINUOUS AMBULATORY PERITONEAL DIALYSIS  (CAPD) CATHETER REMOVAL
Anesthesia: General

## 2014-06-06 MED ORDER — SODIUM POLYSTYRENE SULFONATE 15 GM/60ML PO SUSP
15.0000 g | Freq: Once | ORAL | Status: DC
  Filled 2014-06-06: qty 60

## 2014-06-06 MED ORDER — ONDANSETRON HCL 4 MG/2ML IJ SOLN
INTRAMUSCULAR | Status: AC
  Filled 2014-06-06: qty 2

## 2014-06-06 MED ORDER — SODIUM BICARBONATE 4 % IV SOLN
INTRAVENOUS | Status: AC
  Filled 2014-06-06: qty 5

## 2014-06-06 MED ORDER — SUCCINYLCHOLINE CHLORIDE 20 MG/ML IJ SOLN
INTRAMUSCULAR | Status: DC | PRN
  Administered 2014-06-06: 100 mg via INTRAVENOUS

## 2014-06-06 MED ORDER — BUPIVACAINE-EPINEPHRINE 0.25% -1:200000 IJ SOLN
INTRAMUSCULAR | Status: DC | PRN
  Administered 2014-06-06: 3 mL

## 2014-06-06 MED ORDER — OXYCODONE HCL 5 MG PO TABS
5.0000 mg | ORAL_TABLET | ORAL | Status: DC | PRN
  Administered 2014-06-06: 10 mg via ORAL

## 2014-06-06 MED ORDER — SODIUM CHLORIDE 0.9 % IV SOLN
INTRAVENOUS | Status: DC
  Administered 2014-06-06: 10:00:00 via INTRAVENOUS

## 2014-06-06 MED ORDER — GLYCOPYRROLATE 0.2 MG/ML IJ SOLN
INTRAMUSCULAR | Status: AC
  Filled 2014-06-06: qty 2

## 2014-06-06 MED ORDER — FENTANYL CITRATE 0.05 MG/ML IJ SOLN
INTRAMUSCULAR | Status: AC
  Filled 2014-06-06: qty 5

## 2014-06-06 MED ORDER — LIDOCAINE HCL (CARDIAC) 20 MG/ML IV SOLN
INTRAVENOUS | Status: DC | PRN
  Administered 2014-06-06: 80 mg via INTRAVENOUS

## 2014-06-06 MED ORDER — ONDANSETRON HCL 4 MG/2ML IJ SOLN
INTRAMUSCULAR | Status: DC | PRN
Start: 2014-06-06 — End: 2014-06-06
  Administered 2014-06-06: 4 mg via INTRAVENOUS

## 2014-06-06 MED ORDER — FENTANYL CITRATE 0.05 MG/ML IJ SOLN
INTRAMUSCULAR | Status: DC | PRN
  Administered 2014-06-06: 50 ug via INTRAVENOUS

## 2014-06-06 MED ORDER — NEOSTIGMINE METHYLSULFATE 10 MG/10ML IV SOLN
INTRAVENOUS | Status: AC
  Filled 2014-06-06: qty 1

## 2014-06-06 MED ORDER — POVIDONE-IODINE 10 % EX OINT
TOPICAL_OINTMENT | CUTANEOUS | Status: AC
  Filled 2014-06-06: qty 28.35

## 2014-06-06 MED ORDER — ACETAMINOPHEN 325 MG PO TABS: 650.0000 mg | ORAL_TABLET | ORAL | Status: DC | PRN

## 2014-06-06 MED ORDER — BUPIVACAINE-EPINEPHRINE (PF) 0.25% -1:200000 IJ SOLN
INTRAMUSCULAR | Status: AC
  Filled 2014-06-06: qty 30

## 2014-06-06 MED ORDER — PROPOFOL 10 MG/ML IV BOLUS
INTRAVENOUS | Status: DC | PRN
  Administered 2014-06-06: 200 mg via INTRAVENOUS

## 2014-06-06 MED ORDER — MIDAZOLAM HCL 5 MG/5ML IJ SOLN
INTRAMUSCULAR | Status: DC | PRN
  Administered 2014-06-06: 2 mg via INTRAVENOUS

## 2014-06-06 MED ORDER — OXYCODONE-ACETAMINOPHEN 5-325 MG PO TABS
1.0000 | ORAL_TABLET | ORAL | Status: DC | PRN
Start: 2014-06-06 — End: 2023-04-08

## 2014-06-06 MED ORDER — FENTANYL CITRATE 0.05 MG/ML IJ SOLN: 25.0000 ug | INTRAMUSCULAR | Status: DC | PRN

## 2014-06-06 MED ORDER — PROMETHAZINE HCL 25 MG/ML IJ SOLN: 6.2500 mg | INTRAMUSCULAR | Status: DC | PRN

## 2014-06-06 MED ORDER — PROPOFOL 10 MG/ML IV BOLUS
INTRAVENOUS | Status: AC
  Filled 2014-06-06: qty 20

## 2014-06-06 MED ORDER — OXYCODONE HCL 5 MG PO TABS
ORAL_TABLET | ORAL | Status: AC
  Filled 2014-06-06: qty 2

## 2014-06-06 MED ORDER — LIDOCAINE HCL (CARDIAC) 20 MG/ML IV SOLN
INTRAVENOUS | Status: AC
  Filled 2014-06-06: qty 5

## 2014-06-06 MED ORDER — DEXAMETHASONE SODIUM PHOSPHATE 4 MG/ML IJ SOLN
INTRAMUSCULAR | Status: AC
  Filled 2014-06-06: qty 2

## 2014-06-06 MED ORDER — ROCURONIUM BROMIDE 50 MG/5ML IV SOLN
INTRAVENOUS | Status: AC
  Filled 2014-06-06: qty 1

## 2014-06-06 MED ORDER — CHLORHEXIDINE GLUCONATE 4 % EX LIQD
1.0000 "application " | Freq: Once | CUTANEOUS | Status: DC
  Filled 2014-06-06: qty 15

## 2014-06-06 MED ORDER — LIDOCAINE HCL (PF) 1 % IJ SOLN
INTRAMUSCULAR | Status: AC
  Filled 2014-06-06: qty 30

## 2014-06-06 MED ORDER — MIDAZOLAM HCL 2 MG/2ML IJ SOLN
INTRAMUSCULAR | Status: AC
  Filled 2014-06-06: qty 2

## 2014-06-06 MED ORDER — ACETAMINOPHEN 650 MG RE SUPP: 650.0000 mg | RECTAL | Status: DC | PRN

## 2014-06-06 MED ORDER — 0.9 % SODIUM CHLORIDE (POUR BTL) OPTIME
TOPICAL | Status: DC | PRN
  Administered 2014-06-06: 1000 mL

## 2014-06-06 SURGICAL SUPPLY — 49 items
ADAPTER CATH SAFE LCK II CO PK (MISCELLANEOUS) ×1 IMPLANT
ADAPTER SAFE LOCK II CO PACK (MISCELLANEOUS) ×1
BLADE SURG 15 STRL LF DISP TIS (BLADE) ×1 IMPLANT
BLADE SURG 15 STRL SS (BLADE) ×1
CANISTER SUCTION 2500CC (MISCELLANEOUS) IMPLANT
CATH MONCRIEF POPOVICH (CATHETERS) IMPLANT
CATH SWAN NECK TORON 7CM LT (CATHETERS) IMPLANT
CHLORAPREP W/TINT 26ML (MISCELLANEOUS) ×2 IMPLANT
COIL SWAN NECK LT (MISCELLANEOUS) IMPLANT
COIL SWAN NECK RT (MISCELLANEOUS) IMPLANT
COVER SURGICAL LIGHT HANDLE (MISCELLANEOUS) ×2 IMPLANT
DECANTER SPIKE VIAL GLASS SM (MISCELLANEOUS) ×6 IMPLANT
DRAPE LAPAROTOMY T 102X78X121 (DRAPES) ×2 IMPLANT
DRAPE UTILITY XL STRL (DRAPES) ×4 IMPLANT
ELECT CAUTERY BLADE 6.4 (BLADE) ×2 IMPLANT
ELECT REM PT RETURN 9FT ADLT (ELECTROSURGICAL) ×2
ELECTRODE REM PT RTRN 9FT ADLT (ELECTROSURGICAL) ×1 IMPLANT
GAUZE SPONGE 4X4 12PLY STRL (GAUZE/BANDAGES/DRESSINGS) ×2 IMPLANT
GAUZE SPONGE 4X4 16PLY XRAY LF (GAUZE/BANDAGES/DRESSINGS) ×2 IMPLANT
GLOVE BIO SURGEON STRL SZ7.5 (GLOVE) ×2 IMPLANT
GLOVE BIOGEL PI IND STRL 8 (GLOVE) ×1 IMPLANT
GLOVE BIOGEL PI INDICATOR 8 (GLOVE) ×1
GOWN STRL REUS W/ TWL XL LVL3 (GOWN DISPOSABLE) ×1 IMPLANT
GOWN STRL REUS W/TWL XL LVL3 (GOWN DISPOSABLE) ×1
KIT BASIN OR (CUSTOM PROCEDURE TRAY) ×2 IMPLANT
KIT ROOM TURNOVER OR (KITS) ×2 IMPLANT
LIQUID BAND (GAUZE/BANDAGES/DRESSINGS) ×2 IMPLANT
NEEDLE HYPO 25GX1X1/2 BEV (NEEDLE) ×2 IMPLANT
NS IRRIG 1000ML POUR BTL (IV SOLUTION) ×2 IMPLANT
PACK SURGICAL SETUP 50X90 (CUSTOM PROCEDURE TRAY) ×2 IMPLANT
PAD ARMBOARD 7.5X6 YLW CONV (MISCELLANEOUS) ×4 IMPLANT
PENCIL BUTTON HOLSTER BLD 10FT (ELECTRODE) ×2 IMPLANT
SET EXTENSION TUBING 8  CATH (SET/KITS/TRAYS/PACK) ×2 IMPLANT
SET Y SINGLE USE DIALYSIS (HEMODIALYSIS SUPPLIES) IMPLANT
SPONGE LAP 4X18 X RAY DECT (DISPOSABLE) ×2 IMPLANT
SUT CHROMIC 3 0 SH 27 (SUTURE) ×4 IMPLANT
SUT ETHILON 5 0 PS 2 18 (SUTURE) ×2 IMPLANT
SUT MNCRL AB 3-0 PS2 18 (SUTURE) ×2 IMPLANT
SUT PROLENE 2 0 CT2 30 (SUTURE) ×2 IMPLANT
SUT VIC AB 2-0 CT3 27 (SUTURE) ×2 IMPLANT
SUT VICRYL 0 UR6 27IN ABS (SUTURE) ×2 IMPLANT
SYR 30ML LL (SYRINGE) ×2 IMPLANT
SYR BULB 3OZ (MISCELLANEOUS) IMPLANT
SYR CONTROL 10ML LL (SYRINGE) ×2 IMPLANT
TOWEL OR 17X24 6PK STRL BLUE (TOWEL DISPOSABLE) ×2 IMPLANT
TOWEL OR 17X26 10 PK STRL BLUE (TOWEL DISPOSABLE) ×2 IMPLANT
TUBE CONNECTING 12X1/4 (SUCTIONS) IMPLANT
WATER STERILE IRR 1000ML POUR (IV SOLUTION) IMPLANT
YANKAUER SUCT BULB TIP NO VENT (SUCTIONS) IMPLANT

## 2014-06-06 NOTE — Transfer of Care (Signed)
Immediate Anesthesia Transfer of Care Note  Patient: Tyler Moore A Satz  Procedure(s) Performed: Procedure(s): CONTINUOUS AMBULATORY PERITONEAL DIALYSIS  (CAPD) CATHETER REMOVAL (N/A)  Patient Location: PACU  Anesthesia Type:General  Level of Consciousness: awake, alert , oriented and patient cooperative  Airway & Oxygen Therapy: Patient Spontanous Breathing and Patient connected to face mask oxygen  Post-op Assessment: Report given to RN, Post -op Vital signs reviewed and stable and Patient moving all extremities  Post vital signs: Reviewed and stable  Last Vitals:  Filed Vitals:   06/06/14 1002  BP: 171/102  Pulse: 98  Temp: 36.9 C  Resp: 18    Complications: No apparent anesthesia complications

## 2014-06-06 NOTE — Progress Notes (Signed)
Patient sent home with kayexlate to take due to high K+ per order.

## 2014-06-06 NOTE — Anesthesia Preprocedure Evaluation (Signed)
Anesthesia Evaluation  Patient identified by MRN, date of birth, ID band Patient awake    Reviewed: Allergy & Precautions, NPO status , Patient's Chart, lab work & pertinent test results  Airway Mallampati: II  TM Distance: >3 FB Neck ROM: Full    Dental no notable dental hx.    Pulmonary sleep apnea ,  breath sounds clear to auscultation  Pulmonary exam normal       Cardiovascular hypertension, Rhythm:Regular Rate:Normal     Neuro/Psych negative neurological ROS  negative psych ROS   GI/Hepatic negative GI ROS, Neg liver ROS,   Endo/Other  negative endocrine ROS  Renal/GU DialysisRenal disease  negative genitourinary   Musculoskeletal negative musculoskeletal ROS (+)   Abdominal   Peds negative pediatric ROS (+)  Hematology negative hematology ROS (+)   Anesthesia Other Findings   Reproductive/Obstetrics negative OB ROS                             Anesthesia Physical Anesthesia Plan  ASA: III  Anesthesia Plan: General   Post-op Pain Management:    Induction: Intravenous  Airway Management Planned: Oral ETT  Additional Equipment:   Intra-op Plan:   Post-operative Plan: Extubation in OR  Informed Consent: I have reviewed the patients History and Physical, chart, labs and discussed the procedure including the risks, benefits and alternatives for the proposed anesthesia with the patient or authorized representative who has indicated his/her understanding and acceptance.   Dental advisory given  Plan Discussed with: CRNA and Surgeon  Anesthesia Plan Comments:         Anesthesia Quick Evaluation

## 2014-06-06 NOTE — Op Note (Signed)
06/06/2014  11:51 AM  PATIENT:  Tyler Moore  44 y.o. male  PRE-OPERATIVE DIAGNOSIS:  CAPD  POST-OPERATIVE DIAGNOSIS: CAPD  PROCEDURE:  Procedure(s): CONTINUOUS AMBULATORY PERITONEAL DIALYSIS  (CAPD) CATHETER REMOVAL (N/A)  SURGEON:  Surgeon(s) and Role:    * Axel FillerArmando Meir Elwood, MD - Primary  ANESTHESIA:   local and general  EBL:   min  BLOOD ADMINISTERED:none  DRAINS: none   LOCAL MEDICATIONS USED:  BUPIVICAINE   SPECIMEN:  No Specimen  DISPOSITION OF SPECIMEN:  N/A  COUNTS:  YES  TOURNIQUET:  * No tourniquets in log *  DICTATION: .Dragon Dictation The patient was consented he was taken back to the operating room and placed in the supine position with bilateral SCDs in place. The patient was prepped and draped in the usual sterile fashion. A timeout was called and all facts were verified.  A 1 cm incision was made just over the area of the insertion of the CAPD catheter into the abdomen. Blunt dissection was taken down to the fascia and the cuff was exposed. At this time the pigtail portion the catheter was easily removed. An 0 Vicryl was used in interrupted fashion 1 to reapproximate the fashion the site. At this time the catheter from its exit site of the skin was bluntly dissected cephalad. Secondary to the cuff at the curve of the catheter A1 centimeter incision was made just turned at the left subcostal margin. Blunt dissection was taken place to the catheter and the cuff was exposed this allowed me to free up the catheter lumen catheter in its entirety. All components were accounted for.  At this time a 3-0 Vicryl was used to reapproximate the deep dermal layer at the 1 cm and at all incision sites.  At this time and 3-0 Monocryl was used to reapproximate the skin in a subcuticular fashion. Skin was dressed with Dermabond.  The patient tied the procedure well was taken to the recovery room stable condition.   PLAN OF CARE: Discharge to home after PACU  PATIENT  DISPOSITION:  PACU - hemodynamically stable.   Delay start of Pharmacological VTE agent (>24hrs) due to surgical blood loss or risk of bleeding: not applicable

## 2014-06-06 NOTE — H&P (Signed)
History of Present Illness Axel Filler(Laren Orama MD; 04/23/2014 2:53 PM) Patient words: Recheck CAPD Catheter.  The patient is a 44 year old male who presents with a complaint of capd catheter. Patient is a 44 year old male who recently underwent replacement of a CAPD catheter. Patient states that since that time the catheter has not been functioning. He is continue with hemodialysis at this time.  He does still state that he has some sanguinous fluid that was evacuated. The fluid is able to be infiltrated however is not able to be removed from his abdomen.     Medication History Maryan Puls(Christy Moore, [place]; 04/23/2014 2:43 PM) Medications Reconciled  Vitals Maryan Puls(Christy Moore MA; 04/23/2014 2:43 PM) 04/23/2014 2:42 PM Weight: 277.2 lb Temp.: 67F(Temporal)  Pulse: 101 (Regular)  Resp.: 18 (Unlabored)  BP: 174/102 (Sitting, Left Arm, Standard)    Physical Exam Axel Filler(Marcie Shearon MD; 04/23/2014 2:53 PM) General Mental Status-Alert. General Appearance-Consistent with stated age. Hydration-Well hydrated. Voice-Normal.  Head and Neck Head-normocephalic, atraumatic with no lesions or palpable masses. Trachea-midline. Thyroid Gland Characteristics - normal size and consistency.  Chest and Lung Exam Chest and lung exam reveals -quiet, even and easy respiratory effort with no use of accessory muscles and on auscultation, normal breath sounds, no adventitious sounds and normal vocal resonance. Inspection Chest Wall - Normal. Back - normal.  Abdomen Note: Left CAPD catheter was granulation tissue at the exit site wounds are clean dry and intact     Assessment & Plan Axel Filler(Fumiye Lubben MD; 04/23/2014 2:55 PM) POST-OPERATIVE STATE (V45.89  Z98.89) Impression: 44 year old male with end-stage renal disease and peroneal dialysis CAPD catheter.  This was recently replaced. Patient states that he is had no function of the CAPD catheter. Fluid is able to be infiltrated however is not  able to be removed.  Patient is continuing with hemodialysis at this time. Unfortunately the patient may be at the end of a CAPD treatments. Previous surgeries reveal minimal scar tissue in the area. Patient's body habitus may be a contributing factor.  Pt would like to have the catheter removed and will con't with HD To OR for CAPD removal

## 2014-06-06 NOTE — Anesthesia Procedure Notes (Signed)
Procedure Name: Intubation Date/Time: 06/06/2014 11:15 AM Performed by: Jerilee HohMUMM, Delorise Hunkele N Pre-anesthesia Checklist: Patient identified, Emergency Drugs available, Suction available and Patient being monitored Patient Re-evaluated:Patient Re-evaluated prior to inductionOxygen Delivery Method: Circle system utilized Preoxygenation: Pre-oxygenation with 100% oxygen Intubation Type: IV induction Ventilation: Mask ventilation without difficulty Laryngoscope Size: Mac and 4 Grade View: Grade I Tube type: Oral Tube size: 7.5 mm Number of attempts: 1 Airway Equipment and Method: Stylet Placement Confirmation: ETT inserted through vocal cords under direct vision,  positive ETCO2 and breath sounds checked- equal and bilateral Secured at: 22 cm Tube secured with: Tape Dental Injury: Teeth and Oropharynx as per pre-operative assessment

## 2014-06-07 ENCOUNTER — Encounter (HOSPITAL_COMMUNITY): Payer: Self-pay | Admitting: General Surgery

## 2014-06-08 NOTE — Anesthesia Postprocedure Evaluation (Signed)
  Anesthesia Post-op Note  Patient: Tyler Moore  Procedure(s) Performed: Procedure(s) (LRB): CONTINUOUS AMBULATORY PERITONEAL DIALYSIS  (CAPD) CATHETER REMOVAL (N/A)  Patient Location: PACU  Anesthesia Type: General  Level of Consciousness: awake and alert   Airway and Oxygen Therapy: Patient Spontanous Breathing  Post-op Pain: mild  Post-op Assessment: Post-op Vital signs reviewed, Patient's Cardiovascular Status Stable, Respiratory Function Stable, Patent Airway and No signs of Nausea or vomiting  Last Vitals:  Filed Vitals:   06/06/14 1422  BP:   Pulse: 100  Temp:   Resp: 18    Post-op Vital Signs: stable   Complications: No apparent anesthesia complications

## 2019-03-27 ENCOUNTER — Ambulatory Visit: Payer: Medicare Other | Attending: Internal Medicine

## 2019-03-27 DIAGNOSIS — Z20822 Contact with and (suspected) exposure to covid-19: Secondary | ICD-10-CM

## 2019-03-29 LAB — NOVEL CORONAVIRUS, NAA: SARS-CoV-2, NAA: NOT DETECTED

## 2019-04-07 ENCOUNTER — Ambulatory Visit: Payer: Medicare Other | Attending: Internal Medicine

## 2019-04-07 DIAGNOSIS — Z20822 Contact with and (suspected) exposure to covid-19: Secondary | ICD-10-CM

## 2019-04-09 LAB — NOVEL CORONAVIRUS, NAA: SARS-CoV-2, NAA: NOT DETECTED

## 2019-04-24 ENCOUNTER — Ambulatory Visit: Payer: Medicare Other | Attending: Internal Medicine

## 2019-04-24 DIAGNOSIS — Z20822 Contact with and (suspected) exposure to covid-19: Secondary | ICD-10-CM

## 2019-04-25 LAB — NOVEL CORONAVIRUS, NAA: SARS-CoV-2, NAA: NOT DETECTED

## 2019-05-16 ENCOUNTER — Ambulatory Visit: Payer: Medicare Other | Attending: Internal Medicine

## 2019-05-16 DIAGNOSIS — Z20822 Contact with and (suspected) exposure to covid-19: Secondary | ICD-10-CM

## 2019-05-17 LAB — NOVEL CORONAVIRUS, NAA: SARS-CoV-2, NAA: NOT DETECTED

## 2023-04-06 ENCOUNTER — Other Ambulatory Visit: Payer: Self-pay | Admitting: *Deleted

## 2023-04-06 DIAGNOSIS — N186 End stage renal disease: Secondary | ICD-10-CM

## 2023-04-07 NOTE — Progress Notes (Signed)
 HISTORY AND PHYSICAL     CC:  dialysis access Requesting Provider:  Douglass Lunger, MD  HPI: This is a 53 y.o. male here for evaluation of his hemodialysis access.    Pt has hx of renal transplant in 2017.  This is working well.  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  He states that when he bends his elbow, he gets numbness in his hand.  He states that a couple of years ago, he had a test that showed a narrowing in the vein in the upper arm or chest.  There was no intervention on this.  He states that when they were using the fistula it was working well.  He denies any hx of stroke symptoms with visual changes, speech issues or one sided weakness, numbness, or paralysis.  Dialysis access history: -right  BC AVF 08/23/2009 Dr. Gerlean -PD cath placement 10/04/2009 Dr. Mikell -repositioning of PD cath & lysis of adhesions 01/08/2014 & 02/21/2014 Dr. Rubin -removal of PD cath 06/06/2014 Dr. Rubin   The pt is not on a statin for cholesterol management.  The pt is on a daily aspirin.  Other AC:  none The pt is on CCB, ARB, BB for hypertension.  The pt is not on medication for diabetes.   Tobacco hx:  never  Pt is a Interior And Spatial Designer.   Past Medical History:  Diagnosis Date   Blood dyscrasia    TTP   Chronic kidney disease    End stage   GERD (gastroesophageal reflux disease)    takes Omeprazole daily   HTN (hypertension)    takes Amlodipine,Metoprolol,and Losartan daily   Pneumonia    Sleep apnea    sleep study in epic from 2014   Wears glasses     Past Surgical History:  Procedure Laterality Date   AV FISTULA PLACEMENT     CAPD INSERTION N/A 01/08/2014   Procedure: DIAGNOSTIC LAPAROSCOPY WITH LYSIS OF ADHESION AND REPOSITION OF CONTINUOUS AMBULATORY PERITONEAL DIALYSIS  (CAPD) CATHETER;  Surgeon: Lynda Rubin, MD;  Location: [facility] OR;  Service: General;  Laterality: N/A;   CAPD INSERTION N/A 02/21/2014   Procedure: LAPAROSCOPIC REPLACEMENT CONTINUOUS  AMBULATORY PERITONEAL DIALYSIS  (CAPD) CATHETER;  Surgeon: Lynda Rubin, MD;  Location: [facility] OR;  Service: General;  Laterality: N/A;   CAPD REMOVAL N/A 06/06/2014   Procedure: CONTINUOUS AMBULATORY PERITONEAL DIALYSIS  (CAPD) CATHETER REMOVAL;  Surgeon: Lynda Rubin, MD;  Location: [facility] OR;  Service: General;  Laterality: N/A;   LYSIS OF ADHESION N/A 02/21/2014   Procedure: LYSIS OF ADHESION;  Surgeon: Lynda Rubin, MD;  Location: [facility] OR;  Service: General;  Laterality: N/A;   OTHER SURGICAL HISTORY     catheter placement in stomach   PERITONEAL CATHETER INSERTION  2011    No Known Allergies  Current Outpatient Medications  Medication Sig Dispense Refill   amLODipine (NORVASC) 10 MG tablet Take 10 mg by mouth daily.  12   aspirin EC 81 MG tablet Take 81 mg by mouth daily.     b complex-vitamin c-folic acid (NEPHRO-VITE) 0.8 MG TABS Take 0.8 mg by mouth at bedtime.     calcium acetate (PHOSLO) 667 MG capsule Take 2,668 mg by mouth 3 (three) times daily with meals.     gentamicin cream (GARAMYCIN) 0.1 % Apply 1 application topically daily.   20   lanthanum (FOSRENOL) 1000 MG chewable tablet Chew 1,000 mg by mouth 3 (three) times daily with meals.  lidocaine -prilocaine (EMLA) cream Apply 1 application topically 3 (three) times daily as needed (applied to dialysis for pain).     losartan (COZAAR) 50 MG tablet Take 50 mg by mouth 2 (two) times daily.      metoprolol tartrate (LOPRESSOR) 25 MG tablet Take 25 mg by mouth 2 (two) times daily.     omeprazole (PRILOSEC) 20 MG capsule Take 20 mg by mouth daily.      oxyCODONE -acetaminophen  (ROXICET) 5-325 MG per tablet Take 1-2 tablets by mouth every 4 (four) hours as needed. 30 tablet 0   SENSIPAR 30 MG tablet Take 30 mg by mouth 2 (two) times daily.  12   No current facility-administered medications for this visit.    Family History  Problem Relation Age of Onset   Cancer Mother        breast   Hypertension Sister     Social  History   Socioeconomic History   Marital status: Single    Spouse name: Not on file   Number of children: Not on file   Years of education: Not on file   Highest education level: Not on file  Occupational History   Occupation: care provider  Tobacco Use   Smoking status: Never   Smokeless tobacco: Never  Substance and Sexual Activity   Alcohol use: No   Drug use: Yes    Types: Marijuana   Sexual activity: Yes  Other Topics Concern   Not on file  Social History Narrative   Not on file   Social Drivers of Health   Financial Resource Strain: Not on file  Food Insecurity: Not on file  Transportation Needs: Not on file  Physical Activity: Not on file  Stress: Not on file  Social Connections: Not on file  Intimate Partner Violence: Not on file     ROS: [x]  Positive   [ ]  Negative   [ ]  All sytems reviewed and are negative  Cardiac: []  chest pain/pressure []  SOB/DOE  Vascular: []  pain in legs while walking []  pain in feet when lying flat []  swelling in legs  Pulmonary: []  asthma []  wheezing  Neurologic: []  hx CVA/TIA  Hematologic: []  bleeding problems  GI []  GERD  GU: [x]  CKD/renal failure  []  HD---[]  M/W/F []  T/T/S  Psychiatric: []  hx of major depression  Integumentary: []  rashes []  ulcers  Constitutional: []  fever []  chills   PHYSICAL EXAMINATION:  Today's Vitals   04/08/23 0823  BP: (!) 148/77  Pulse: 90  Resp: 18  Temp: 98.2 F (36.8 C)  TempSrc: Temporal  SpO2: 96%  Weight: 270 lb 4.8 oz (122.6 kg)  Height: 5' 8 (1.727 m)   Body mass index is 41.1 kg/m.    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  The fistula is pulsatile,  which goes with history of stenosis in the upper arm/chest a couple of years ago.  Discussed with Dr. Sheree and would avoid fistulogram due to use of contrast.  Discussed with pt that if we plicate the fistula or do any intervention, he would run the risk of losing the fistula.  He would like to keep it in the event he would ever need dialysis again in the future.  He would like to discuss this with his wife.   Carotid bruit -I did hear bilateral carotid bruits with right > left on exam today.  He has not had any stroke sx.  I discussed bringing him back in the next month for a carotid duplex and will see MD and have further discussion about plan for fistula and results of carotid duplex.  He is in agreement with this plan.    Lucie Apt, [facility] [phone]  Clinic MD:   Sheree

## 2023-04-08 ENCOUNTER — Ambulatory Visit (HOSPITAL_COMMUNITY)
Admission: RE | Admit: 2023-04-08 | Discharge: 2023-04-08 | Disposition: A | Discharge status: Home / Self Care | Payer: Medicaid Other | Source: Ambulatory Visit | Attending: Vascular Surgery | Admitting: Vascular Surgery

## 2023-04-08 ENCOUNTER — Encounter: Payer: Self-pay | Admitting: Physician Assistant

## 2023-04-08 ENCOUNTER — Ambulatory Visit (INDEPENDENT_AMBULATORY_CARE_PROVIDER_SITE_OTHER): Payer: Medicaid Other | Admitting: Physician Assistant

## 2023-04-08 VITALS — BP 148/77 | HR 90 | Temp 98.2°F | Resp 18 | Ht 68.0 in | Wt 270.3 lb

## 2023-04-08 DIAGNOSIS — R0989 Other specified symptoms and signs involving the circulatory and respiratory systems: Secondary | ICD-10-CM | POA: Diagnosis not present

## 2023-04-08 DIAGNOSIS — N186 End stage renal disease: Secondary | ICD-10-CM | POA: Diagnosis present

## 2023-04-08 DIAGNOSIS — T82898A Other specified complication of vascular prosthetic devices, implants and grafts, initial encounter: Secondary | ICD-10-CM

## 2023-04-20 ENCOUNTER — Other Ambulatory Visit: Payer: Self-pay

## 2023-04-20 DIAGNOSIS — R0989 Other specified symptoms and signs involving the circulatory and respiratory systems: Secondary | ICD-10-CM

## 2023-05-05 ENCOUNTER — Ambulatory Visit: Payer: Medicaid Other | Admitting: Vascular Surgery

## 2023-05-05 ENCOUNTER — Ambulatory Visit (HOSPITAL_COMMUNITY): Payer: Medicaid Other

## 2024-04-14 ENCOUNTER — Other Ambulatory Visit: Payer: Self-pay | Admitting: Vascular Surgery

## 2024-04-14 DIAGNOSIS — R0989 Other specified symptoms and signs involving the circulatory and respiratory systems: Secondary | ICD-10-CM
